# Patient Record
Sex: Female | Born: 1965 | Hispanic: Yes | Marital: Married | Smoking: Former smoker
Health system: Southern US, Community
[De-identification: ages and names within clinical notes are randomized; demographics above are authoritative.]

## PROBLEM LIST (undated history)

## (undated) DIAGNOSIS — F32A Depression, unspecified: Secondary | ICD-10-CM

## (undated) DIAGNOSIS — N921 Excessive and frequent menstruation with irregular cycle: Secondary | ICD-10-CM

## (undated) DIAGNOSIS — D509 Iron deficiency anemia, unspecified: Secondary | ICD-10-CM

## (undated) DIAGNOSIS — F329 Major depressive disorder, single episode, unspecified: Secondary | ICD-10-CM

## (undated) HISTORY — DX: Iron deficiency anemia, unspecified: D50.9

## (undated) HISTORY — DX: Depression, unspecified: F32.A

## (undated) HISTORY — DX: Major depressive disorder, single episode, unspecified: F32.9

## (undated) HISTORY — DX: Excessive and frequent menstruation with irregular cycle: N92.1

## (undated) HISTORY — PX: CHOLECYSTECTOMY: SHX55

---

## 2001-06-21 ENCOUNTER — Emergency Department (HOSPITAL_COMMUNITY): Admission: EM | Admit: 2001-06-21 | Discharge: 2001-06-22 | Payer: Self-pay | Admitting: Emergency Medicine

## 2001-06-22 ENCOUNTER — Encounter: Payer: Self-pay | Admitting: Emergency Medicine

## 2001-10-11 ENCOUNTER — Encounter: Payer: Self-pay | Admitting: Chiropractic Medicine

## 2001-10-11 ENCOUNTER — Ambulatory Visit (HOSPITAL_COMMUNITY): Admission: RE | Admit: 2001-10-11 | Discharge: 2001-10-11 | Payer: Self-pay | Admitting: Chiropractic Medicine

## 2001-11-05 ENCOUNTER — Emergency Department (HOSPITAL_COMMUNITY): Admission: EM | Admit: 2001-11-05 | Discharge: 2001-11-05 | Payer: Self-pay

## 2001-11-27 ENCOUNTER — Encounter: Payer: Self-pay | Admitting: Orthopedic Surgery

## 2001-11-27 ENCOUNTER — Encounter: Admission: RE | Admit: 2001-11-27 | Discharge: 2001-11-27 | Payer: Self-pay | Admitting: Orthopedic Surgery

## 2001-12-11 ENCOUNTER — Encounter: Admission: RE | Admit: 2001-12-11 | Discharge: 2001-12-11 | Payer: Self-pay | Admitting: Orthopedic Surgery

## 2001-12-11 ENCOUNTER — Encounter: Payer: Self-pay | Admitting: Orthopedic Surgery

## 2001-12-22 ENCOUNTER — Encounter: Payer: Self-pay | Admitting: Orthopedic Surgery

## 2001-12-22 ENCOUNTER — Encounter: Admission: RE | Admit: 2001-12-22 | Discharge: 2001-12-22 | Payer: Self-pay | Admitting: Orthopedic Surgery

## 2007-04-27 ENCOUNTER — Emergency Department (HOSPITAL_COMMUNITY): Admission: EM | Admit: 2007-04-27 | Discharge: 2007-04-27 | Payer: Self-pay | Admitting: Emergency Medicine

## 2007-05-01 ENCOUNTER — Ambulatory Visit (HOSPITAL_COMMUNITY): Admission: RE | Admit: 2007-05-01 | Discharge: 2007-05-01 | Payer: Self-pay | Admitting: Surgery

## 2007-05-01 ENCOUNTER — Inpatient Hospital Stay (HOSPITAL_COMMUNITY): Admission: EM | Admit: 2007-05-01 | Discharge: 2007-05-04 | Payer: Self-pay | Admitting: Emergency Medicine

## 2007-05-02 ENCOUNTER — Encounter (INDEPENDENT_AMBULATORY_CARE_PROVIDER_SITE_OTHER): Payer: Self-pay | Admitting: *Deleted

## 2007-05-08 ENCOUNTER — Inpatient Hospital Stay (HOSPITAL_COMMUNITY): Admission: EM | Admit: 2007-05-08 | Discharge: 2007-05-10 | Payer: Self-pay | Admitting: Emergency Medicine

## 2007-05-13 ENCOUNTER — Ambulatory Visit: Payer: Self-pay | Admitting: Gastroenterology

## 2007-05-16 ENCOUNTER — Ambulatory Visit: Payer: Self-pay | Admitting: Surgery

## 2007-05-16 ENCOUNTER — Encounter (INDEPENDENT_AMBULATORY_CARE_PROVIDER_SITE_OTHER): Payer: Self-pay | Admitting: Surgery

## 2007-05-16 ENCOUNTER — Ambulatory Visit (HOSPITAL_COMMUNITY): Admission: RE | Admit: 2007-05-16 | Discharge: 2007-05-16 | Payer: Self-pay | Admitting: Surgery

## 2007-06-10 ENCOUNTER — Encounter: Admission: RE | Admit: 2007-06-10 | Discharge: 2007-06-10 | Payer: Self-pay | Admitting: *Deleted

## 2007-07-03 ENCOUNTER — Ambulatory Visit (HOSPITAL_COMMUNITY): Admission: RE | Admit: 2007-07-03 | Discharge: 2007-07-03 | Payer: Self-pay | Admitting: Gastroenterology

## 2007-07-15 ENCOUNTER — Ambulatory Visit: Payer: Self-pay | Admitting: Gastroenterology

## 2010-04-25 ENCOUNTER — Encounter: Payer: Self-pay | Admitting: Internal Medicine

## 2010-04-25 ENCOUNTER — Ambulatory Visit: Payer: Self-pay | Admitting: Internal Medicine

## 2010-04-25 DIAGNOSIS — F329 Major depressive disorder, single episode, unspecified: Secondary | ICD-10-CM

## 2010-04-25 DIAGNOSIS — G43909 Migraine, unspecified, not intractable, without status migrainosus: Secondary | ICD-10-CM | POA: Insufficient documentation

## 2010-04-26 ENCOUNTER — Telehealth: Payer: Self-pay | Admitting: Licensed Clinical Social Worker

## 2010-04-26 ENCOUNTER — Telehealth: Payer: Self-pay | Admitting: Internal Medicine

## 2010-05-01 ENCOUNTER — Ambulatory Visit: Payer: Self-pay | Admitting: Internal Medicine

## 2010-05-01 ENCOUNTER — Encounter: Payer: Self-pay | Admitting: Internal Medicine

## 2010-05-01 LAB — CONVERTED CEMR LAB
ALT: 12 units/L (ref 0–35)
Albumin: 4.1 g/dL (ref 3.5–5.2)
Alkaline Phosphatase: 58 units/L (ref 39–117)
CO2: 25 meq/L (ref 19–32)
Cholesterol: 194 mg/dL (ref 0–200)
Glucose, Bld: 98 mg/dL (ref 70–99)
MCHC: 32.8 g/dL (ref 30.0–36.0)
MCV: 86.7 fL (ref >=78.0)
Platelets: 270 10*3/uL (ref 150–400)
Potassium: 3.8 meq/L (ref 3.5–5.3)
Sodium: 139 meq/L (ref 135–145)
Total Bilirubin: 0.8 mg/dL (ref 0.3–1.2)
Total Protein: 7.1 g/dL (ref 6.0–8.3)
Triglycerides: 127 mg/dL (ref <150)

## 2010-05-05 ENCOUNTER — Ambulatory Visit: Payer: Self-pay | Admitting: Family Medicine

## 2010-05-23 ENCOUNTER — Ambulatory Visit: Payer: Self-pay | Admitting: Internal Medicine

## 2010-05-23 ENCOUNTER — Encounter: Payer: Self-pay | Admitting: Internal Medicine

## 2010-05-23 DIAGNOSIS — N920 Excessive and frequent menstruation with regular cycle: Secondary | ICD-10-CM

## 2010-05-23 DIAGNOSIS — Z862 Personal history of diseases of the blood and blood-forming organs and certain disorders involving the immune mechanism: Secondary | ICD-10-CM

## 2010-05-23 LAB — CONVERTED CEMR LAB
Ferritin: 6 ng/mL — ABNORMAL LOW (ref 10–291)
Vitamin B-12: 363 pg/mL (ref 211–911)

## 2010-05-31 ENCOUNTER — Ambulatory Visit (HOSPITAL_COMMUNITY): Admission: RE | Admit: 2010-05-31 | Discharge: 2010-05-31 | Payer: Self-pay | Admitting: Internal Medicine

## 2010-06-05 ENCOUNTER — Encounter: Payer: Self-pay | Admitting: Internal Medicine

## 2010-06-27 ENCOUNTER — Ambulatory Visit: Payer: Self-pay | Admitting: Internal Medicine

## 2010-06-27 ENCOUNTER — Encounter: Payer: Self-pay | Admitting: Licensed Clinical Social Worker

## 2010-07-05 ENCOUNTER — Other Ambulatory Visit
Admission: RE | Admit: 2010-07-05 | Discharge: 2010-07-05 | Payer: Self-pay | Source: Home / Self Care | Admitting: Obstetrics and Gynecology

## 2010-07-05 ENCOUNTER — Ambulatory Visit: Payer: Self-pay | Admitting: Obstetrics and Gynecology

## 2010-07-05 ENCOUNTER — Ambulatory Visit (HOSPITAL_COMMUNITY)
Admission: RE | Admit: 2010-07-05 | Discharge: 2010-07-05 | Payer: Self-pay | Source: Home / Self Care | Attending: Internal Medicine | Admitting: Internal Medicine

## 2010-07-05 LAB — HM MAMMOGRAPHY: HM Mammogram: NEGATIVE

## 2010-07-20 ENCOUNTER — Ambulatory Visit: Payer: Self-pay | Admitting: Obstetrics and Gynecology

## 2010-07-23 HISTORY — PX: DILATION AND CURETTAGE OF UTERUS: SHX78

## 2010-07-24 ENCOUNTER — Ambulatory Visit: Admission: RE | Admit: 2010-07-24 | Discharge: 2010-07-24 | Payer: Self-pay | Source: Home / Self Care

## 2010-08-02 ENCOUNTER — Ambulatory Visit (HOSPITAL_COMMUNITY)
Admission: RE | Admit: 2010-08-02 | Discharge: 2010-08-02 | Payer: Self-pay | Source: Home / Self Care | Attending: Family Medicine | Admitting: Family Medicine

## 2010-08-02 ENCOUNTER — Ambulatory Visit (HOSPITAL_COMMUNITY): Admission: RE | Admit: 2010-08-02 | Payer: Self-pay | Source: Home / Self Care | Admitting: Family Medicine

## 2010-08-08 ENCOUNTER — Ambulatory Visit: Admission: RE | Admit: 2010-08-08 | Discharge: 2010-08-08 | Payer: Self-pay | Source: Home / Self Care

## 2010-08-08 ENCOUNTER — Encounter: Payer: Self-pay | Admitting: Licensed Clinical Social Worker

## 2010-08-08 LAB — CONVERTED CEMR LAB
Blood, UA: NEGATIVE
Leukocytes, UA: NEGATIVE
Nitrite: NEGATIVE
Protein, ur: NEGATIVE mg/dL
Urine Glucose: NEGATIVE mg/dL
Urobilinogen, UA: 0.2 (ref 0.0–1.0)

## 2010-08-10 ENCOUNTER — Ambulatory Visit
Admission: RE | Admit: 2010-08-10 | Discharge: 2010-08-10 | Payer: Self-pay | Source: Home / Self Care | Attending: Obstetrics and Gynecology | Admitting: Obstetrics and Gynecology

## 2010-08-11 NOTE — Progress Notes (Unsigned)
NAME:  Heidi Galvan, Heidi Galvan           ACCOUNT NO.:  [id number]  MEDICAL RECORD NO.:  [id number]          PATIENT TYPE:  WOC  LOCATION:  [facility]                   FACILITY:  [facility]  PHYSICIAN:  Argentina Donovan, MD        DATE OF BIRTH:  [birth year]  DATE OF SERVICE:  08/10/2010                                 CLINIC NOTE  HISTORY:  The patient is a 45 year old English-speaking Hispanic female who had an endometrial biopsy because she was having very heavy periods twice a month, lasting 5-7 days for which she used 7 pads a day.  She did endometrial biopsy, turned out to be fine.  She had a previous ultrasound that showed diffuse thickening in the endometrium, but we sent her for a hysterosalpingogram that showed same without polypoid filling defect to suggest polyp or fibroid.  So for the present time we will do nothing, I think that if this continues, she is a nonsmoker, we will place her on oral contraceptives.  I have told her this and have her come back in a year unless her periods persist and being heavy.  IMPRESSION:  Dysfunctional uterine bleeding.          ______________________________ Argentina Donovan, MD    PR/MEDQ  D:  08/10/2010  T:  08/11/2010  Job:  [id number]

## 2010-08-23 ENCOUNTER — Encounter: Payer: Self-pay | Admitting: *Deleted

## 2010-08-23 ENCOUNTER — Telehealth: Payer: Self-pay | Admitting: *Deleted

## 2010-08-23 NOTE — Telephone Encounter (Signed)
Pt called with c/o not being able to walk normal.  Also leg pain.  This is not new but worse. Will see tomorrow at 1:15 Pt informed

## 2010-08-24 ENCOUNTER — Other Ambulatory Visit: Payer: Self-pay | Admitting: Internal Medicine

## 2010-08-24 ENCOUNTER — Ambulatory Visit (HOSPITAL_COMMUNITY)
Admission: RE | Admit: 2010-08-24 | Discharge: 2010-08-24 | Disposition: A | Discharge status: Home / Self Care | Payer: Self-pay | Source: Ambulatory Visit | Attending: Internal Medicine | Admitting: Internal Medicine

## 2010-08-24 ENCOUNTER — Ambulatory Visit (INDEPENDENT_AMBULATORY_CARE_PROVIDER_SITE_OTHER): Payer: Self-pay | Admitting: Internal Medicine

## 2010-08-24 ENCOUNTER — Telehealth: Payer: Self-pay

## 2010-08-24 ENCOUNTER — Encounter: Payer: Self-pay | Admitting: Internal Medicine

## 2010-08-24 ENCOUNTER — Ambulatory Visit: Payer: Self-pay | Admitting: Internal Medicine

## 2010-08-24 VITALS — BP 124/78 | HR 79 | Temp 98.1°F | Ht 58.5 in | Wt 168.4 lb

## 2010-08-24 DIAGNOSIS — R300 Dysuria: Secondary | ICD-10-CM

## 2010-08-24 DIAGNOSIS — R1031 Right lower quadrant pain: Secondary | ICD-10-CM

## 2010-08-24 DIAGNOSIS — D259 Leiomyoma of uterus, unspecified: Secondary | ICD-10-CM | POA: Insufficient documentation

## 2010-08-24 DIAGNOSIS — R509 Fever, unspecified: Secondary | ICD-10-CM | POA: Insufficient documentation

## 2010-08-24 DIAGNOSIS — K449 Diaphragmatic hernia without obstruction or gangrene: Secondary | ICD-10-CM | POA: Insufficient documentation

## 2010-08-24 DIAGNOSIS — N7013 Chronic salpingitis and oophoritis: Secondary | ICD-10-CM | POA: Insufficient documentation

## 2010-08-24 LAB — CBC WITH DIFFERENTIAL/PLATELET
Basophils Absolute: 0.1 10*3/uL (ref 0.0–0.1)
Basophils Relative: 1 % (ref 0–1)
HCT: 36 % (ref 36.0–46.0)
Lymphocytes Relative: 27 % (ref 12–46)
MCHC: 34.2 g/dL (ref 30.0–36.0)
Monocytes Absolute: 0.4 10*3/uL (ref 0.1–1.0)
Monocytes Relative: 6 % (ref 3–12)
Neutro Abs: 4.2 10*3/uL (ref 1.7–7.7)
Neutrophils Relative %: 65 % (ref 43–77)
Platelets: 275 10*3/uL (ref 150–400)
RDW: 14.6 % (ref 11.5–15.5)
WBC: 6.4 10*3/uL (ref 4.0–10.5)

## 2010-08-24 LAB — POCT URINALYSIS DIPSTICK
Glucose, UA: NEGATIVE
Ketones, UA: NEGATIVE
Leukocytes, UA: NEGATIVE
Nitrite, UA: NEGATIVE
Protein, UA: NEGATIVE
Urobilinogen, UA: 0.2

## 2010-08-24 LAB — BASIC METABOLIC PANEL
BUN: 11 mg/dL (ref 6–23)
Calcium: 8.1 mg/dL — ABNORMAL LOW (ref 8.4–10.5)
Glucose, Bld: 89 mg/dL (ref 70–99)
Potassium: 3.6 mEq/L (ref 3.5–5.3)

## 2010-08-24 MED ORDER — IOHEXOL 300 MG/ML  SOLN
100.0000 mL | Freq: Once | INTRAMUSCULAR | Status: AC | PRN
  Administered 2010-08-24: 100 mL via INTRAVENOUS

## 2010-08-24 NOTE — Assessment & Plan Note (Signed)
Summary: est-ck/fu/meds/cfb   Vital Signs:  Patient profile:   45 year old female Height:      59.5 inches (151.13 cm) Weight:      160.3 pounds (72.86 kg) BMI:     31.95 Temp:     97.7 degrees F oral Pulse rate:   74 / minute BP sitting:   121 / 80  (right arm) Cuff size:   regular  Vitals Entered By: Chinita Pester RN (June 27, 2010 9:18 AM) CC: F/U.  Med refills. Continues to c/o right lower back pain.  ?sinus pain w/right ear pain. Is Patient Diabetic? No Pain Assessment Patient in pain? yes     Location: right lower back Intensity: 7 Type: stabbing Onset of pain  Constant; worse in the morning Nutritional Status BMI of > 30 = obese  Have you ever been in a relationship where you felt threatened, hurt or afraid?No   Does patient need assistance? Functional Status Self care Ambulation Normal   CC:  F/U.  Med refills. Continues to c/o right lower back pain.  ?sinus pain w/right ear pain.Marland Kitchen  History of Present Illness: 1. C/o facial TTP and front bilateral HA. described as "congestion," with chills, and green,m thich nasal discharge for 5 days. Denies any visual or speech deficits or weakness. 2.Iron-deff-cy anemia 2/2 DUB ->patient missed her OB/GYN appointment on June 25, 2010. Takes Feosol. Reports felling much more energetic. 3. Depression. Reports felling much better with Fluoxetine. No SI/HI or mania; no side-effects with med.  Depression History:      The patient denies a depressed mood most of the day and a diminished interest in her usual daily activities.         Preventive Screening-Counseling & Management  Alcohol-Tobacco     Alcohol drinks/day: 0     Smoking Status: never  Caffeine-Diet-Exercise     Does Patient Exercise: yes     Type of exercise: DVD's/weights  Medications Prior to Update: 1)  Fluoxetine Hcl 40 Mg Caps (Fluoxetine Hcl) .... Take One Tablet By Mouth Daily 2)  Naproxen 500 Mg Tabs (Naproxen) .... Take One Tablet By Mouth Two  Times A Day As Needed 3)  Ferrous Fumarate 325 (106 Fe) Mg Tabs (Ferrous Fumarate) .... Take One Tablet Three Times A Day With Orange Juice For Anemia  Allergies (verified): 1)  ! Penicillin  Past History:  Past medical, surgical, family and social histories (including risk factors) reviewed for relevance to current acute and chronic problems.  Past Medical History: Reviewed history from 04/25/2010 and no changes required. Depression  Family History: Reviewed history from 04/25/2010 and no changes required. Sister->depression  Social History: Reviewed history from 04/25/2010 and no changes required. unemployed; uninsured no nicotine, ETOH, or illicit drug use. high school level education separated from her husband.  Physical Exam  General:  Well-developed,well-nourished,in no acute distress; alert,appropriate and cooperative throughout examination Head:  Normocephalic and atraumatic without obvious abnormalities. No apparent alopecia or balding. Eyes:  vision grossly intact.   Ears:  External ear exam shows no significant lesions or deformities.  Otoscopic examination reveals clear canals, tympanic membranes are intact bilaterally without bulging, retraction, inflammation or discharge. Hearing is grossly normal bilaterally. Nose:  Bilateral frontal simus TTP noted; erythmatous turbinates with thick, green nasal discharge present. Mouth:  Oral mucosa and oropharynx without lesions or exudates.  Teeth in good repair. Neck:  No deformities, masses, or tenderness noted. Lungs:  Normal respiratory effort, chest expands symmetrically. Lungs are clear to auscultation,  no crackles or wheezes. Heart:  Normal rate and regular rhythm. S1 and S2 normal without gallop, murmur, click, rub or other extra sounds. Abdomen:  Bowel sounds positive,abdomen soft and non-tender without masses, organomegaly or hernias noted. Msk:  no joint tenderness, no joint swelling, and no redness over joints.     Extremities:  No clubbing, cyanosis, edema, or deformity noted with normal full range of motion of all joints.   Neurologic:  alert & oriented X3, cranial nerves II-XII intact, and gait normal.   Skin:  Intact without suspicious lesions or rashes Cervical Nodes:  No lymphadenopathy noted Psych:  Cognition and judgment appear intact. Alert and cooperative with normal attention span and concentration. 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 Scheduled a follow up appointment with OB/GYN on July 05, 2010. Continue with by mouth Iron therapy.  Problem # 2:  EXCESSIVE MENSTRUAL BLEEDING (ICD-626.2) Assessment: Unchanged  Per above, pelvic US revealed endometrial thickening vs leiomyoma ->will f/u with OB'GYN on 07/05/2010. Importance of a follow up wawss emphasized.  Discussed medication use.   Problem # 3:  DEPRESSION (ICD-311) Assessment: Improved  Improved.  Her updated medication list for this problem includes:    Fluoxetine Hcl 40 Mg Caps (Fluoxetine hcl) .Marland Kitchen... Take one tablet by mouth daily  Discussed treatment options, including trial of antidpressant medication. Will refer to behavioral health. Follow-up call in in 24-48 hours and recheck in 2 weeks, sooner as needed. Patient agrees to call if any worsening of symptoms or thoughts of doing harm arise. Verified that the patient has no suicidal ideation at this time.   Problem # 4:  ACUTE FRONTAL SINUSITIS (ICD-461.1)  Saline nasal irriations three times a day as needed. F/U in 10 days if no improvement. Her updated medication list for this problem includes:    Ery-tab 333 Mg Tbec (Erythromycin base) .Marland Kitchen... Take one tablet by mouth q 8 hours with meals for 10 days    Flonase 50 Mcg/act Susp (Fluticasone propionate) ..... One spray q nare daily  Instructed on treatment. Call if symptoms persist or  worsen.   Complete Medication List: 1)  Fluoxetine Hcl 40 Mg Caps (Fluoxetine hcl) .... Take one tablet by mouth daily 2)  Naproxen 500 Mg Tabs (Naproxen) .... Take one tablet by mouth two times a day as needed 3)  Ferrous Fumarate 325 (106 Fe) Mg Tabs (Ferrous fumarate) .... Take one tablet three times a day with orange juice for anemia 4)  Ery-tab 333 Mg Tbec (Erythromycin base) .... Take one tablet by mouth q 8 hours with meals for 10 days 5)  Flonase 50 Mcg/act Susp (Fluticasone propionate) .... One spray q nare daily  Other Orders: Ophthalmology Referral (Ophthalmology) Mammogram (Screening) (Mammo)  Patient Instructions: 1)  Please, pick up yr prescriptions at the pharmacy. 2)  Please, follow up with your gynecologist referral on December 14th, 2011. 3)  Please, drink plenty of fluids and call with any questions. Prescriptions: ERY-TAB 333 MG TBEC (ERYTHROMYCIN BASE) Take one tablet by mouth q 8 hours with meals for 10 days  #30 x 0   Entered and Authorized by:   Deatra Robinson MD   Signed by:   Deatra Robinson MD on 06/27/2010   Method used:   Print then Give to Patient   RxID:   [id number] ERY-TAB 333 MG TBEC (ERYTHROMYCIN BASE) Take one tablet by mouth q 8  hours with meals for 10 days  #30 x 0   Entered and Authorized by:   Deatra Robinson MD   Signed by:   Deatra Robinson MD on 06/27/2010   Method used:   Electronically to        [facility]. [phone]* (retail)       [address]       [place], [place]  [address]       Ph: [phone]       Fax: [phone]   RxID:   [id number] FLONASE 50 MCG/ACT SUSP (FLUTICASONE PROPIONATE) One spray q nare daily  #1 x 0   Entered and Authorized by:   Deatra Robinson MD   Signed by:   Deatra Robinson MD on 06/27/2010   Method used:   Electronically to        [facility]. [phone]* (retail)       [address]       [place], [place]  [address]       Ph:  [phone]       Fax: [phone]   RxID:   [id number]    Orders Added: 1)  Est. Patient Level IV [[address]] 2)  Ophthalmology Referral [Ophthalmology] 3)  Mammogram (Screening) [Mammo]     Prevention & Chronic Care Immunizations   Influenza vaccine: Fluvax Non-MCR  (04/25/2010)    Tetanus booster: 04/25/2010: Tdap    Pneumococcal vaccine: Not documented  Other Screening   Pap smear: Not documented    Mammogram: Not documented   Smoking status: never  (06/27/2010)  Lipids   Total Cholesterol: 194  (05/01/2010)   LDL: 113  (05/01/2010)   LDL Direct: Not documented   HDL: 56  (05/01/2010)   Triglycerides: 127  (05/01/2010)

## 2010-08-24 NOTE — Miscellaneous (Signed)
Summary: Soc. Work  Consult with Dr. Denton Meek who informed me that patient was doing much better on antidepressant prescribed but likely could use resources relating to the divorce and children.   Gave information concerning [facility] counseling who will serve uninsureds and the entire family for counseling.

## 2010-08-24 NOTE — Assessment & Plan Note (Signed)
Summary: EST-4 WEEK RECHECK/CH   Vital Signs:  Patient profile:   45 year old female Height:      59.5 inches (151.13 cm) Weight:      161.4 pounds (73.36 kg) BMI:     32.17 Temp:     97.7 degrees F (36.50 degrees C) oral Pulse rate:   91 / minute BP sitting:   138 / 86  (left arm) Cuff size:   regular  Vitals Entered By: Cynda Familia Duncan Dull) (May 23, 2010 4:22 PM) CC: f/u, lab results Is Patient Diabetic? No Pain Assessment Patient in pain? no      Nutritional Status BMI of > 30 = obese  Have you ever been in a relationship where you felt threatened, hurt or afraid?No   Does patient need assistance? Functional Status Self care Ambulation Normal   CC:  f/u and lab results.  History of Present Illness: Follow up appointment. 1. reports fatigue, feeling sad, weight gain with an increased appetite (gained  6 lbs over 3 months). patient states that she is under a lot of stress at home --has marital issues and difficult relationship with her children. Denis SI/HI or mania.    Depression History:      The patient denies a depressed mood most of the day and a diminished interest in her usual daily activities.  Positive alarm features for depression include significant weight gain, insomnia, fatigue (loss of energy), feelings of worthlessness (guilt), and impaired concentration (indecisiveness).  However, she denies significant weight loss, psychomotor agitation, psychomotor retardation, and recurrent thoughts of death or suicide.  The patient denies symptoms of a manic disorder including persistently & abnormally elevated mood, abnormally & persistently irritable mood, less need for sleep, talkative or feels need to keep talking, distractibility, flight of ideas, increase in goal-directed activity, psychomotor agitation, inflated self-esteem or grandiosity, excessive buying sprees, excessive sexual indiscretions, and excessive foolish business investments.    Psychosocial stress factors include a recent marital separation and major life changes.  The patient denies that she feels like life is not worth living, denies that she wishes that she were dead, and denies that she has thought about ending her life.        Comments:  "feel better".   Preventive Screening-Counseling & Management  Alcohol-Tobacco     Alcohol drinks/day: 0     Smoking Status: never  Allergies: 1)  ! Penicillin  Past History:  Past Medical History: Last updated: 04/25/2010 Depression  Family History: Last updated: 04/25/2010 Sister->depression  Social History: Last updated: 04/25/2010 unemployed; uninsured no nicotine, ETOH, or illicit drug use. high school level education separated from her husband.  Risk Factors: Alcohol Use: 0 (05/23/2010) Exercise: yes (04/25/2010)  Risk Factors: Smoking Status: never (05/23/2010)  Review of Systems       per HPI  Physical Exam  General:  Well-developed,well-nourished,in no acute distress; alert,appropriate and cooperative throughout examination Head:  Normocephalic and atraumatic without obvious abnormalities. No apparent alopecia or balding. Nose:  External nasal examination shows no deformity or inflammation. Nasal mucosa are pink and moist without lesions or exudates. Mouth:  pharynx pink and moist, no erythema, no exudates, no posterior lymphoid hypertrophy, and fair dentition.   Neck:  No deformities, masses, or tenderness noted. Lungs:  Normal respiratory effort, chest expands symmetrically. Lungs are clear to auscultation, no crackles or wheezes. Heart:  Normal rate and regular rhythm. S1 and S2 normal without gallop, murmur, click, rub or other extra sounds. Abdomen:  Bowel sounds positive,abdomen soft and non-tender without masses, organomegaly or hernias noted. Msk:  No deformity or scoliosis noted of thoracic or lumbar spine.   Extremities:  No clubbing, cyanosis, edema, or deformity noted with  normal full range of motion of all joints.   Neurologic:  alert & oriented X3.   Skin:  turgor normal, color normal, no rashes, no suspicious lesions, and no petechiae.   Cervical Nodes:  No lymphadenopathy noted Psych:  Oriented X3, memory intact for recent and remote, normally interactive, good eye contact, not anxious appearing, and tearful.     Impression & Recommendations:  Problem # 1:  IRON DEFICIENCY ANEMIA, HX OF (ICD-V12.3) Assessment New Ferritin returned at 6. Will need Iron supplementation. Likely 2/2 to DUB. Will follow up on transvaginal US and discuss both results in 1-2 weeks. Orders: T-Ferritin [id number]) T-Vitamin B12 ([id number])  Problem # 2:  EXCESSIVE MENSTRUAL BLEEDING (ICD-626.2) Assessment: New Per above, will order tansvag [place] to evaluate for leiomyomas and endometrial strip; and follow up in 1 week. Orders: Ultrasound (Ultrasound)  Discussed medication use.   Problem # 3:  DEPRESSION (ICD-311) Assessment: Improved  Her updated medication list for this problem includes:    Fluoxetine Hcl 40 Mg Caps (Fluoxetine hcl) .Marland Kitchen... Take one tablet by mouth daily  Discussed treatment options, including trial of antidpressant medication. Will refer to behavioral health. Follow-up call in in 24-48 hours and recheck in 2 weeks, sooner as needed. Patient agrees to call if any worsening of symptoms or thoughts of doing harm arise. Verified that the patient has no suicidal ideation at this time.   Problem # 4:  MIGRAINE UNSP W/O INTRACT W/O STATUS MIGRAINOSUS (ICD-346.90) Assessment: Improved instructed to call 911 or go to ED if signs get progressively worse: in severity, frequency; dizziness, speech or vision deficits; weakness. patient verbalised understanding. Her updated medication list for this problem includes:    Naproxen 500 Mg Tabs (Naproxen) .Marland Kitchen... Take one tablet by mouth two times a day as needed  Headache diary discussed.  Complete Medication List: 1)   Fluoxetine Hcl 40 Mg Caps (Fluoxetine hcl) .... Take one tablet by mouth daily 2)  Naproxen 500 Mg Tabs (Naproxen) .... Take one tablet by mouth two times a day as needed 3)  Ferrous Fumarate 325 (106 Fe) Mg Tabs (Ferrous fumarate) .... Take one tablet three times a day with orange juice for anemia  Patient Instructions: 1)  Please, follow up with your appointment for an [place]. 2)  Please, make an appointment with Dr. Denton Meek for a Well woman exam. 3)  Please, call with any questions. 4)  Follow up after ultrasound. Prescriptions: FERROUS FUMARATE 325 (106 FE) MG TABS (FERROUS FUMARATE) Take one tablet three times a day with orange juice for anemia  #90 x 3   Entered and Authorized by:   Deatra Robinson MD   Signed by:   Deatra Robinson MD on 05/26/2010   Method used:   Faxed to ...       [facility] (retail)       [address] [place], [place]  [address]       Ph: [phone]       Fax: [phone]   RxID:   [id number] FLUOXETINE HCL 40 MG CAPS (FLUOXETINE HCL) Take one tablet by mouth daily  #30 x 11   Entered and Authorized by:   Deatra Robinson MD   Signed by:   Deatra Robinson MD  on 05/23/2010   Method used:   Reprint   RxID:   [id number] NAPROXEN 500 MG TABS (NAPROXEN) Take one tablet by mouth two times a day as needed  #60 x 3   Entered and Authorized by:   Deatra Robinson MD   Signed by:   Deatra Robinson MD on 05/23/2010   Method used:   Print then Give to Patient   RxID:   [id number] FLUOXETINE HCL 40 MG CAPS (FLUOXETINE HCL) Take one tablet by mouth daily  #30 x 11   Entered and Authorized by:   Deatra Robinson MD   Signed by:   Deatra Robinson MD on 05/23/2010   Method used:   Electronically to        [facility]. [phone]* (retail)       [address]       [place], [place]  [address]       Ph: [phone]       Fax: [phone]   RxID:   [id number]    Orders Added: 1)  Est.  Patient Level IV [41660] 2)  T-Ferritin [[id number]] 3)  T-Vitamin B12 [[id number]] 4)  Est. Patient Level IV [23557] 5)  Ultrasound [Ultrasound]   Process Orders Check Orders Results:     [facility]: ABN not required for this insurance Tests Sent for requisitioning (May 29, 2010 3:15 PM):     05/23/2010: [facility] -- T-Ferritin [phone] (signed)     05/23/2010: [facility] -- T-Vitamin B12 [[id number]] (signed)     Prevention & Chronic Care Immunizations   Influenza vaccine: Fluvax Non-MCR  (04/25/2010)    Tetanus booster: 04/25/2010: Tdap    Pneumococcal vaccine: Not documented  Other Screening   Pap smear: Not documented    Mammogram: Not documented   Smoking status: never  (05/23/2010)  Lipids   Total Cholesterol: 194  (05/01/2010)   LDL: 113  (05/01/2010)   LDL Direct: Not documented   HDL: 56  (05/01/2010)   Triglycerides: 127  (05/01/2010)

## 2010-08-24 NOTE — Assessment & Plan Note (Signed)
Summary: HEADACHES/PAIN IN KNEE/SB.   Vital Signs:  Patient profile:   45 year old female Height:      58.5 inches (148.59 cm) Weight:      164 pounds (74.55 kg) BMI:     33.81 Temp:     97.2 degrees F Pulse rate:   82 / minute BP sitting:   123 / 82  (left arm) Cuff size:   regular  Vitals Entered By: Dorie Rank RN (July 24, 2010 3:58 PM) CC: c/o headaches starting about 2 weeks ago, chilly, pain in body, dry nose, sometimes severe, frontal location, "feel tired" different than usual - also c/o pain (pointing to lower right hip area) for 3 months states "I think it is my kidney, feels like something tearing inside sometimes", Depression Is Patient Diabetic? No Pain Assessment Patient in pain? yes     Location: h/a and right hip Intensity: 8 -h/a, right hip - 8 Onset of pain  sometimes pain meds help - also states right shoulder blade hurts at times with right hip area Nutritional Status BMI of > 30 = obese  Have you ever been in a relationship where you felt threatened, hurt or afraid?No   Does patient need assistance? Functional Status Self care Ambulation Normal Comments states last dr checked her kidneys and they were fine - states her 40 yr old son very concerned about her and wants to help her feel better - dtr is 64 "is a rebel...thinks that she knows everything"   CC:  c/o headaches starting about 2 weeks ago, chilly, pain in body, dry nose, sometimes severe, frontal location, "feel tired" different than usual - also c/o pain (pointing to lower right hip area) for 3 months states "I think it is my kidney, feels like something tearing inside sometimes", and Depression.  History of Present Illness: 45 yr old woman with pmhx as described below comes to the clinic complaining of sinus drainage associated with headache, myalgias, and sinus pressure which have been going on for 2 weeks. Patient reports that 2 nights ago she had a fever of 103. Has some dry cough especially  at night.   Depression History:      The patient denies a depressed mood most of the day and a diminished interest in her usual daily activities.        Comments:  pain makes me "irritable" - sone toldher she seemed "sad" -" some days good, some bad".   Preventive Screening-Counseling & Management  Alcohol-Tobacco     Alcohol drinks/day: 0     Smoking Status: never  Caffeine-Diet-Exercise     Does Patient Exercise: yes     Type of exercise: DVD's/weights  Problems Prior to Update: 1)  Acute Frontal Sinusitis  (ICD-461.1) 2)  Iron Deficiency Anemia, Hx of  (ICD-V12.3) 3)  Excessive Menstrual Bleeding  (ICD-626.2) 4)  Preventive Health Care  (ICD-V70.0) 5)  Migraine Unsp w/o Intract w/o Status Migrainosus  (ICD-346.90) 6)  Depression  (ICD-311)  Medications Prior to Update: 1)  Fluoxetine Hcl 40 Mg Caps (Fluoxetine Hcl) .... Take One Tablet By Mouth Daily 2)  Naproxen 500 Mg Tabs (Naproxen) .... Take One Tablet By Mouth Two Times A Day As Needed 3)  Ferrous Fumarate 325 (106 Fe) Mg Tabs (Ferrous Fumarate) .... Take One Tablet Three Times A Day With Orange Juice For Anemia  Current Medications (verified): 1)  Fluoxetine Hcl 40 Mg Caps (Fluoxetine Hcl) .... Take One Tablet By Mouth Daily 2)  Naproxen  500 Mg Tabs (Naproxen) .... Take One Tablet By Mouth Two Times A Day As Needed 3)  Ferrous Fumarate 325 (106 Fe) Mg Tabs (Ferrous Fumarate) .... Take One Tablet Three Times A Day With Orange Juice For Anemia  Allergies: 1)  ! Penicillin  Directives: 1)  Full Code   Past History:  Past Medical History: Last updated: 04/25/2010 Depression  Family History: Last updated: 04/25/2010 Sister->depression  Social History: Last updated: 04/25/2010 unemployed; uninsured no nicotine, ETOH, or illicit drug use. high school level education separated from her husband.  Risk Factors: Alcohol Use: 0 (07/24/2010) Exercise: yes (07/24/2010)  Risk Factors: Smoking Status: never  (07/24/2010)  Family History: Reviewed history from 04/25/2010 and no changes required. Sister->depression  Social History: Reviewed history from 04/25/2010 and no changes required. unemployed; uninsured no nicotine, ETOH, or illicit drug use. high school level education separated from her husband.  Review of Systems       The patient complains of fever and headaches.  The patient denies chest pain, syncope, dyspnea on exertion, peripheral edema, hemoptysis, abdominal pain, melena, hematochezia, severe indigestion/heartburn, hematuria, muscle weakness, difficulty walking, unusual weight change, abnormal bleeding, and enlarged lymph nodes.    Physical Exam  General:  NAD, vitals reviewed Eyes:  vision grossly intact.   Ears:  no external deformities.   Nose:  L frontal sinus tenderness, L maxillary sinus tenderness, R frontal sinus tenderness, and R maxillary sinus tenderness.   Mouth:  pharyngeal erythema, no exudates Neck:  No deformities, masses, or tenderness noted. Lungs:  Normal respiratory effort, chest expands symmetrically. Lungs are clear to auscultation, no crackles or wheezes. Heart:  Normal rate and regular rhythm. S1 and S2 normal without gallop, murmur, click, rub or other extra sounds. Abdomen:  Bowel sounds positive,abdomen soft and non-tender without masses, organomegaly or hernias noted. Msk:  no joint tenderness, no joint swelling, and no redness over joints.   Extremities:  No clubbing, cyanosis, edema, or deformity noted with normal full range of motion of all joints.   Neurologic:  alert & oriented X3, cranial nerves II-XII intact, and gait normal.     Impression & Recommendations:  Problem # 1:  ACUTE FRONTAL SINUSITIS (ICD-461.1) Will treat for acute bacterial sinus infection. Start patient on doxy two times a day for 10 days, sudafed, and instructed to do nasal saline irrigation. Follow up in 2 weeks.  Her updated medication list for this problem  includes:    Doxycycline Hyclate 100 Mg Caps (Doxycycline hyclate) .Marland Kitchen... Take 1 tablet by mouth two times a day x 10 days    Flonase 50 Mcg/act Susp (Fluticasone propionate) .Marland Kitchen... 2 sprays/nostril per day    Sudafed Pe Maximum Strength 10 Mg Tabs (Phenylephrine hcl) .Marland Kitchen... Take by mouth 1-2 tablets every 4 hours as needed for nasal congestion  Orders: T-CBC w/Diff ([id number])  Complete Medication List: 1)  Fluoxetine Hcl 40 Mg Caps (Fluoxetine hcl) .... Take one tablet by mouth daily 2)  Naproxen 500 Mg Tabs (Naproxen) .... Take one tablet by mouth two times a day as needed 3)  Ferrous Fumarate 325 (106 Fe) Mg Tabs (Ferrous fumarate) .... Take one tablet three times a day with orange juice for anemia 4)  Doxycycline Hyclate 100 Mg Caps (Doxycycline hyclate) .... Take 1 tablet by mouth two times a day x 10 days 5)  Flonase 50 Mcg/act Susp (Fluticasone propionate) .... 2 sprays/nostril per day 6)  Sudafed Pe Maximum Strength 10 Mg Tabs (Phenylephrine hcl) .... Take by mouth 1-2  tablets every 4 hours as needed for nasal congestion  Patient Instructions: 1)  Please schedule a follow-up appointment in 2 weeks. 2)  Start taking Doxycycline 100mg  two times a day for 10 days. Also use flonase, Sudafed, and nasal saline irragation. 3)  Take all medication as directed. Prescriptions: SUDAFED PE MAXIMUM STRENGTH 10 MG TABS (PHENYLEPHRINE HCL) Take by mouth 1-2 tablets every 4 hours as needed for nasal congestion  #36 x 0   Entered and Authorized by:   Laren Everts MD   Signed by:   Laren Everts MD on 07/24/2010   Method used:   Faxed to ...       [facility] Department (retail)       [address] [place], [place]  [address]       Ph: [phone]       Fax: [phone]   RxID:   [id number] FLONASE 50 MCG/ACT SUSP (FLUTICASONE PROPIONATE) 2 sprays/nostril per day  #1 x 2   Entered and Authorized by:   Laren Everts MD   Signed by:   Laren Everts MD on 07/24/2010   Method used:   Faxed to ...       [facility] Department (retail)       [address] [place], [place]  [address]       Ph: [phone]       Fax: [phone]   RxID:   [id number] DOXYCYCLINE HYCLATE 100 MG CAPS (DOXYCYCLINE HYCLATE) Take 1 tablet by mouth two times a day X 10 days  #20 x 0   Entered and Authorized by:   Laren Everts MD   Signed by:   Laren Everts MD on 07/24/2010   Method used:   Faxed to ...       [facility] Department (retail)       [address] [place], [place]  [address]       Ph: [phone]       Fax: [phone]   RxID:   [id number] SUDAFED PE MAXIMUM STRENGTH 10 MG TABS (PHENYLEPHRINE HCL) Take by mouth 1-2 tablets every 4 hours as needed for nasal congestion  #36 x 0   Entered and Authorized by:   Laren Everts MD   Signed by:   Laren Everts MD on 07/24/2010   Method used:   Electronically to        [facility] [phone]* (retail)       [address]       [place], [place]  [address]       Ph: [phone]       Fax: [phone]   RxID:   [id number] FLONASE 50 MCG/ACT SUSP (FLUTICASONE PROPIONATE) 2 sprays/nostril per day  #1 x 2   Entered and Authorized by:   Laren Everts MD   Signed by:   Laren Everts MD on 07/24/2010   Method used:   Electronically to        [facility]. [phone]* (retail)       [address]       [place], [place]  [address]       Ph: [phone]       Fax: [phone]   RxID:   [id number] DOXYCYCLINE HYCLATE 100 MG CAPS (DOXYCYCLINE HYCLATE)  Take 1 tablet by mouth two times a day X 10 days  #20 x 0   Entered and Authorized by:   Laren Everts MD   Signed by:   Laren Everts MD on 07/24/2010   Method used:   Electronically to        [facility]. [phone]* (retail)       [address]       [place], [place]  [address]       Ph: [phone]       Fax: [phone]   RxID:   [id number]    Orders Added: 1)  T-CBC w/Diff [[id number]] 2)  Est. Patient Level III [[address]]   Process Orders Check Orders Results:     [facility]: ABN not required for this insurance Tests Sent for requisitioning (July 25, 2010 12:25 PM):     07/24/2010: [facility] -- [facility] w/Diff [[id number]] (signed)

## 2010-08-24 NOTE — Progress Notes (Signed)
Summary: phone note/gp  Phone Note Call from Patient   Summary of Call: Pt. called about fasting labs but her next appt. is at 4:00PM. Could you put in the labs and the pt. can have them drawn prior to the appt.?  Thanks Initial call taken by: Chinita Pester RN,  April 26, 2010 9:20 AM  Follow-up for Phone Call        Provider Notified Follow-up by: Deatra Robinson MD,  April 27, 2010 10:18 PM  New Problems: PREVENTIVE HEALTH CARE (ICD-V70.0)   New Problems: PREVENTIVE HEALTH CARE (ICD-V70.0)  Appended Document: phone note/gp Pt. was called; she will come Mon.10/10 for labs.

## 2010-08-24 NOTE — Progress Notes (Signed)
Summary: Soc. Work  Programme researcher, broadcasting/film/video of Call: Returned patient's phone call.  She is looking for job resources and so I told her I would send job resources in the mail to her including [employer], [employer] and Copywriter, advertising.   In the meantime I see there is SW referral for psyche as well as legal assistance.  She did not mention any of those items in her phone call to me.  I will be out of the office until 10/17 and informed her of that/ will follow-up with her to address multiple issues.

## 2010-08-24 NOTE — Assessment & Plan Note (Signed)
Summary: NEW TO MD AND CLINIC OK TO ADD PER [facility]   Vital Signs:  Patient profile:   45 year old female Height:      59.5 inches (151.13 cm) Weight:      160.2 pounds (72.82 kg) BMI:     31.93 Temp:     97.6 degrees F oral Pulse rate:   77 / minute BP sitting:   127 / 82  (right arm)  Vitals Entered By: Chinita Pester RN (April 25, 2010 3:26 PM) CC: New to clinic/MD.  Heavy menstrual. Right lower back pain which radiates to right groin. Is Patient Diabetic? No Pain Assessment Patient in pain? yes     Location: right lower back Intensity: 8 Type: aching Onset of pain  Intermittent Nutritional Status BMI of > 30 = obese  Have you ever been in a relationship where you felt threatened, hurt or afraid?No   Does patient need assistance? Functional Status Self care Ambulation Normal   CC:  New to clinic/MD.  Heavy menstrual. Right lower back pain which radiates to right groin.Marland Kitchen  History of Present Illness: New patient is here to establish her care; 1. c/o HA x 6 months. bifrontal; pressure like; feels nauseated 'occasionally,'no dizziness; photosensitivity noted; lasts for 2-3 hours at a time; induced with stress. No similar Sx in the past. Notes that HA started with the the onset of depression. 800 mg Ibuprofen alleviates the pain. Denies any nocturnal signs. Has a hx of head trauma (took a fall in 06/2008). Mri was done at that time; per patient's reprot, had a C-spine herniated disc. 2. Feels depressed. Under "lots of stress." Denies SI/HI or mania. Unemployed; separated from her spouse; one daughter is pregnant.  Depression History:      The patient denies a depressed mood most of the day and a diminished interest in her usual daily activities.         Preventive Screening-Counseling & Management  Alcohol-Tobacco     Alcohol drinks/day: 0     Smoking Status: never  Caffeine-Diet-Exercise     Does Patient Exercise: yes     Type of exercise:  DVD's/weights  Problems Prior to Update: 1)  Migraine Unsp w/o Intract w/o Status Migrainosus  (ICD-346.90) 2)  Depression  (ICD-311)  Current Problems (verified): 1)  Migraine Unsp w/o Intract w/o Status Migrainosus  (ICD-346.90) 2)  Depression  (ICD-311)  Medications Prior to Update: 1)  None  Allergies (verified): 1)  ! Penicillin  Directives (verified): 1)  Full Code   Past History:  Past Medical History: Depression  Family History: Reviewed history and no changes required. Sister->depression  Social History: Reviewed history and no changes required. unemployed; uninsured no nicotine, ETOH, or illicit drug use. high school level education separated from her husband.Smoking Status:  never Does Patient Exercise:  yes  Review of Systems General:  Denies chills, fatigue, fever, loss of appetite, malaise, sleep disorder, sweats, weakness, and weight loss. Eyes:  Denies blurring, discharge, double vision, eye irritation, eye pain, halos, itching, light sensitivity, red eye, vision loss-1 eye, and vision loss-both eyes. ENT:  Denies decreased hearing, difficulty swallowing, ear discharge, earache, hoarseness, nasal congestion, nosebleeds, postnasal drainage, ringing in ears, sinus pressure, and sore throat. CV:  Denies bluish discoloration of lips or nails, chest pain or discomfort, difficulty breathing at night, difficulty breathing while lying down, fainting, fatigue, leg cramps with exertion, lightheadness, near fainting, palpitations, shortness of breath with exertion, swelling of feet, swelling of hands, and weight gain.  Resp:  Denies chest discomfort, chest pain with inspiration, cough, coughing up blood, excessive snoring, hypersomnolence, morning headaches, pleuritic, shortness of breath, sputum productive, and wheezing. GI:  Denies abdominal pain, bloody stools, change in bowel habits, constipation, dark tarry stools, diarrhea, excessive appetite, gas, hemorrhoids,  indigestion, loss of appetite, nausea, vomiting, vomiting blood, and yellowish skin color. GU:  Denies abnormal vaginal bleeding, decreased libido, discharge, dysuria, genital sores, hematuria, incontinence, nocturia, urinary frequency, and urinary hesitancy. MS:  Denies joint pain, joint redness, joint swelling, loss of strength, low back pain, mid back pain, muscle aches, muscle , cramps, muscle weakness, stiffness, and thoracic pain.  Physical Exam  General:  Well-developed,well-nourished,in no acute distress; alert,appropriate and cooperative throughout examination Head:  Normocephalic and atraumatic without obvious abnormalities. No apparent alopecia or balding. Eyes:  No corneal or conjunctival inflammation noted. EOMI. Perrla. Funduscopic exam benign, without hemorrhages, exudates or papilledema. Vision grossly normal. Ears:  External ear exam shows no significant lesions or deformities.  Otoscopic examination reveals clear canals, tympanic membranes are intact bilaterally without bulging, retraction, inflammation or discharge. Hearing is grossly normal bilaterally. Nose:  External nasal examination shows no deformity or inflammation. Nasal mucosa are pink and moist without lesions or exudates. Mouth:  pharynx pink and moist, no erythema, no exudates, no posterior lymphoid hypertrophy, and fair dentition.   Neck:  No deformities, masses, or tenderness noted. Chest Wall:  No deformities, masses, or tenderness noted. Breasts:  deferred Lungs:  Normal respiratory effort, chest expands symmetrically. Lungs are clear to auscultation, no crackles or wheezes. Heart:  Normal rate and regular rhythm. S1 and S2 normal without gallop, murmur, click, rub or other extra sounds. Abdomen:  Bowel sounds positive,abdomen soft and non-tender without masses, organomegaly or hernias noted. Rectal:  refused Genitalia:  refused Msk:  No deformity or scoliosis noted of thoracic or lumbar spine.   Pulses:  R and  L carotid,radial,femoral,dorsalis pedis and posterior tibial pulses are full and equal bilaterally Extremities:  No clubbing, cyanosis, edema, or deformity noted with normal full range of motion of all joints.   Neurologic:  No cranial nerve deficits noted. Station and gait are normal. Plantar reflexes are down-going bilaterally. DTRs are symmetrical throughout. Sensory, motor and coordinative functions appear intact. Skin:  Intact without suspicious lesions or rashes Cervical Nodes:  No lymphadenopathy noted Axillary Nodes:  No palpable lymphadenopathy Inguinal Nodes:  No significant adenopathy Psych:  Oriented X3, memory intact for recent and remote, normally interactive, good eye contact, not anxious appearing, and tearful.     Impression & Recommendations:  Problem # 1:  MIGRAINE UNSP W/O INTRACT W/O STATUS MIGRAINOSUS (ICD-346.90) Assessment New  No "red flags." Likely, depression-related. Headache diary requested. Patient is to use Acetaminophen or Ibuprofen OTC as needed. Patient is to call ASAP if headaches worsen. Instructed to call 911 or go to ER if feels worse.   Problem # 2:  DEPRESSION (ICD-311) Assessment: New  Situational due to poor relationship with spouse and children. Will start SSRI and refer to a psychologist. Spend 50 minutes in a discussion of depression, etiology, risk factors; treatment. Regular daily physical exercise and balanced diet advised. Will f/u in 4 weeks. Unable to check TSH, CBC, C-met due to patient's lack of insurance. Patient requested to defer lab tests. Her updated medication list for this problem includes:    Fluoxetine Hcl 20 Mg Caps (Fluoxetine hcl) .Marland Kitchen... Take one tablet by mouth qhs  Orders: Social Work Referral (Social )  Discussed treatment options, including trial  of antidpressant medication. Will refer to behavioral health. Follow-up call in in 24-48 hours and recheck in 2 weeks, sooner as needed. Patient agrees to call if any worsening  of symptoms or thoughts of doing harm arise. Verified that the patient has no suicidal ideation at this time.   Complete Medication List: 1)  Fluoxetine Hcl 20 Mg Caps (Fluoxetine hcl) .... Take one tablet by mouth qhs  Other Orders: Influenza Vaccine NON MCR (81191) Tdap => 59yrs IM (47829) Admin 1st Vaccine ([address]213)  Patient Instructions: 1)  Please, make an appointment with Georges Mouse for depression counselling. 2)  Please, make an appointment with Chaunce[facility] Reading for a financial assistance. 3)  Once you have a [place] "orange card," we will be able to refer you for a mammogram, have blood tests done, etc. 4)  Please, call with any questions. 5)  Go to ER or call 911 if you feel suicidal or feeling like hurting anyone. 6)  Please, follow up in 4 weeks or sooner. Prescriptions: FLUOXETINE HCL 20 MG CAPS (FLUOXETINE HCL) Take one tablet by mouth qhs  #30 x 6   Entered and Authorized by:   Deatra Robinson MD   Signed by:   Deatra Robinson MD on 04/25/2010   Method used:   Electronically to        [facility] [phone]* (retail)       [address]       [place], [place]  [address]       Ph: [phone]       Fax: [phone]   RxID:   [id number]   Prevention & Chronic Care Immunizations   Influenza vaccine: Fluvax Non-MCR  (04/25/2010)    Tetanus booster: 04/25/2010: Tdap    Pneumococcal vaccine: Not documented  Other Screening   Pap smear: Not documented    Mammogram: Not documented   Smoking status: never  (04/25/2010)  Lipids   Total Cholesterol: Not documented   LDL: Not documented   LDL Direct: Not documented   HDL: Not documented   Triglycerides: Not documented      Resource handout printed.     Tetanus/Td Vaccine    Vaccine Type: Tdap    Site: left deltoid    Mfr: GlaxoSmithKline    Dose: 0.5 ml    Route: IM    Given by: Chinita Pester RN    Exp. Date: 04/12/2012    Lot #: [id number]    VIS given:  06/09/08 version given April 25, 2010.  Influenza Vaccine    Vaccine Type: Fluvax Non-MCR    Site: right deltoid    Mfr: GlaxoSmithKline    Dose: 0.5 ml    Route: IM    Given by: Chinita Pester RN    Exp. Date: 01/20/2011    Lot #: [id number]    VIS given: 02/14/10 version given April 25, 2010.  Flu Vaccine Consent Questions    Do you have a history of severe allergic reactions to this vaccine? no    Any prior history of allergic reactions to egg and/or gelatin? no    Do you have a sensitivity to the preservative Thimersol? no    Do you have a past history of Guillan-Barre Syndrome? no    Do you currently have an acute febrile illness? no    Have you ever had a severe reaction to latex? no    Vaccine information given and explained to patient?  yes    Are you currently pregnant? no

## 2010-08-24 NOTE — Assessment & Plan Note (Signed)
Summary: Heidi Galvan/2 WEEK RECHECK/CH   Vital Signs:  Patient profile:   45 year old female Height:      58.5 inches Weight:      165.5 pounds BMI:     34.12 Temp:     97.4 degrees F oral Pulse rate:   82 / minute BP sitting:   133 / 86  (right arm)  Vitals Entered By: Filomena Jungling NT II (August 08, 2010 9:59 AM) CC: FOLLOW-up visit/ DEPRESSION Is Patient Diabetic? No Pain Assessment Patient in pain? no      Nutritional Status BMI of > 30 = obese  Have you ever been in a relationship where you felt threatened, hurt or afraid?No   Does patient need assistance? Functional Status Self care Ambulation Normal   CC:  FOLLOW-up visit/ DEPRESSION.  History of Present Illness: 45 yr old woman with pmhx as described in the EMR comes to the clinic for 2 week f/u of sinus drainage associated with headache, myalgias, dry cough and sinus pressure with fever of 103.  She has now completed a 10 day course of doxycycline.  SHe is feeling a little bit better but not 100%.  She is still experiencing sinus pressure and congestion.  She has had 2 fevers of 102 since completing her antibiotics.  She is also reporting a cough now productive of green mucous.   SHe also reports chest pain that worsens with breathing.   She denies ear pain and admits to occasional sore throat, particularly at night associated with thirst.  SHe denies sick contacts or recent travel.   She reports some dysuria over the past week; denies hematuria and flank pain.  She reports increased stress at home 2/2 recent separation from her husband, difficulty with her teenage children, and  financial worries.  She is taking her prozac without adverse effect and feels this has slightly helped her mood.  She is interested in talking with someone about her problems; she feels she has little support at home as she is responsible for helping everyone else.  Denies DI/HI  Depression History:      The patient denies a depressed mood  most of the day and a diminished interest in her usual daily activities.         Preventive Screening-Counseling & Management  Alcohol-Tobacco     Alcohol drinks/day: 0     Smoking Status: never  Caffeine-Diet-Exercise     Does Patient Exercise: yes     Type of exercise: DVD's/weights  Current Medications (verified): 1)  Fluoxetine Hcl 40 Mg Caps (Fluoxetine Hcl) .... Take One Tablet By Mouth Daily 2)  Naproxen 500 Mg Tabs (Naproxen) .... Take One Tablet By Mouth Two Times A Day As Needed 3)  Ferrous Fumarate 325 (106 Fe) Mg Tabs (Ferrous Fumarate) .... Take One Tablet Three Times A Day With Orange Juice For Anemia 4)  Sulfamethoxazole-Trimethoprim 800-160 Mg/28ml Susp (Sulfamethoxazole-Trimethoprim) .... Take 1 Tablet By Mouth Two Times A Day For 7 Days 5)  Flonase 50 Mcg/act Susp (Fluticasone Propionate) .... 2 Sprays/nostril Per Day 6)  Sudafed Pe Maximum Strength 10 Mg Tabs (Phenylephrine Hcl) .... Take By Mouth 1-2 Tablets Every 4 Hours As Needed For Nasal Congestion  Allergies (verified): 1)  ! Penicillin  Past History:  Past medical, surgical, family and social histories (including risk factors) reviewed for relevance to current acute and chronic problems.  Past Medical History: Reviewed history from 04/25/2010 and no changes required. Depression  Family History: Reviewed history from 04/25/2010  and no changes required. Sister->depression  Social History: Reviewed history from 04/25/2010 and no changes required. unemployed; uninsured no nicotine, ETOH, or illicit drug use. high school level education separated from her husband.  Physical Exam  General:  NAD, vitals reviewed  well-developed, well-nourished, and well-hydrated.   Head:  Normocephalic and atraumatic without obvious abnormalities. No apparent alopecia or balding. Eyes:  vision grossly intact.  EOMI. PERRLA.  Sclerae anicteric.   Mouth:  no pharyngeal erythema, no exudates. pharynx pink and moist.     Neck:  supple, full ROM, and no masses.   Lungs:  normal respiratory effort, no accessory muscle use, normal breath sounds, no crackles, and no wheezes.   No ronchi.  No dullness to percussion Heart:  Normal rate and regular rhythm. S1 and S2 normal without gallop, murmur, click, rub or other extra sounds. Abdomen:  Bowel sounds positive,abdomen soft and non-tender without masses, organomegaly or hernias noted. Msk:  no joint tenderness, no joint swelling, and no redness over joints.   Neurologic:  alert & oriented X3, cranial nerves II-XII intact, and gait normal.  strength normal in all extremities and sensation intact to light touch.   Skin:  turgor normal, color normal, and no rashes.   Psych:  Oriented X3, memory intact for recent and remote, normally interactive, good eye contact, not anxious appearing, and not depressed appearing.     Impression & Recommendations:  Problem # 1:  FEVER, RECURRENT (ICD-087.9) I believe pts fever may reflect persistent sinus infection.  PNA is also possible, as she reports interval development of a productive cough and mild pleurtic chest pain; however she has no findings on exam to suggest PNA.   UTI is also possible, given her hx of dysurai; will check U/A today to eval for other sources of her fever.  WIll treat her with another course of abx; a 7 day course of Bactrim should provide sufficient overage for upper respiratory organisms as well as common urinary tract pathogens.  Discussed the risk/benefits of obtaining a CXR to eval for PNA; pt declines CXR at this time - I feel this is reasonable given her lack of examination findings to suggest PNA and CXR would be unlikely to alter managment at this time.    Pt is advised to f/u if her symptoms to not improve in 2 weeks.  At that time, if she is still febrile and symptomatic; would consider CT head to eval for possible loculated sinus infection.  WOuld also reconsider CXR at that time.  Orders: T-Culture,  Urine ([id number]) T-Urinalysis ([id number])  Problem # 2:  DYSURIA (ICD-788.1) May represent source of pt persistent fever.  WIll check U/A/.   Please refer to assessment/plan outlined in #1.  Her updated medication list for this problem includes:    Sulfamethoxazole-trimethoprim 800-160 Mg/64ml Susp (Sulfamethoxazole-trimethoprim) .Marland Kitchen... Take 1 tablet by mouth two times a day for 7 days  Orders: T-Culture, Urine ([id number]) T-Urinalysis ([id number])  Problem # 3:  DEPRESSION (ICD-311) Pt denies SI/HI and is tolerating prozac well.  WIll refer her to [facility] to discuss mental health referral and local mental health resources.  Her updated medication list for this problem includes:    Fluoxetine Hcl 40 Mg Caps (Fluoxetine hcl) .Marland Kitchen... Take one tablet by mouth daily  Orders: Social Work Referral (Social )  Problem # 4:  ACUTE FRONTAL SINUSITIS (ICD-461.1) WIll treat with an additonal course of abx.  Please refer to plan outlined in problem #1 for more information.  Her updated medication list for this problem includes:    Sulfamethoxazole-trimethoprim 800-160 Mg/18ml Susp (Sulfamethoxazole-trimethoprim) .Marland Kitchen... Take 1 tablet by mouth two times a day for 7 days    Flonase 50 Mcg/act Susp (Fluticasone propionate) .Marland Kitchen... 2 sprays/nostril per day    Sudafed Pe Maximum Strength 10 Mg Tabs (Phenylephrine hcl) .Marland Kitchen... Take by mouth 1-2 tablets every 4 hours as needed for nasal congestion   >32min with at least 50% face to face time spent couseling pt and coordinating care with other providers.  Complete Medication List: 1)  Fluoxetine Hcl 40 Mg Caps (Fluoxetine hcl) .... Take one tablet by mouth daily 2)  Naproxen 500 Mg Tabs (Naproxen) .... Take one tablet by mouth two times a day as needed 3)  Ferrous Fumarate 325 (106 Fe) Mg Tabs (Ferrous fumarate) .... Take one tablet three times a day with orange juice for anemia 4)  Sulfamethoxazole-trimethoprim 800-160 Mg/30ml Susp  (Sulfamethoxazole-trimethoprim) .... Take 1 tablet by mouth two times a day for 7 days 5)  Flonase 50 Mcg/act Susp (Fluticasone propionate) .... 2 sprays/nostril per day 6)  Sudafed Pe Maximum Strength 10 Mg Tabs (Phenylephrine hcl) .... Take by mouth 1-2 tablets every 4 hours as needed for nasal congestion  Patient Instructions: 1)  Please schedule a follow-up appointment as needed. 2)  If your symptoms do not get better in 2 weeks, schedule an appt to be seen in clinic. 3)  Take you antibiotics as directed. 4)  Use the information Hanley Seamen gives you to find someone to talk to about stress. Prescriptions: SULFAMETHOXAZOLE-TRIMETHOPRIM 800-160 MG/20ML SUSP (SULFAMETHOXAZOLE-TRIMETHOPRIM) Take 1 tablet by mouth two times a day for 7 days  #14 x 0   Entered and Authorized by:   Nelda Bucks DO   Signed by:   Nelda Bucks DO on 08/08/2010   Method used:   Faxed to ...       [facility] Department (retail)       [address] [place], [place]  [address]       Ph: [phone]       Fax: [phone]   RxID:   [id number] SULFAMETHOXAZOLE-TRIMETHOPRIM 800-160 MG/20ML SUSP (SULFAMETHOXAZOLE-TRIMETHOPRIM) Take 1 tablet by mouth two times a day for 7 days  #14 x 0   Entered and Authorized by:   Nelda Bucks DO   Signed by:   Nelda Bucks DO on 08/08/2010   Method used:   Print then Give to Patient   RxID:   [id number]    Orders Added: 1)  T-Culture, Urine [[id number]] 2)  Social Work Referral [facility] ] 3)  T-Urinalysis [[id number]] 4)  Est. Patient Level IV [84166]    Prevention & Chronic Care Immunizations   Influenza vaccine: Fluvax Non-MCR  (04/25/2010)    Tetanus booster: 04/25/2010: Tdap    Pneumococcal vaccine: Not documented  Other Screening   Pap smear: Not documented    Mammogram: ASSESSMENT: Negative - BI-RADS 1^MM DIGITAL SCREENING  (07/05/2010)   Smoking status: never  (08/08/2010)  Lipids   Total Cholesterol: 194   (05/01/2010)   LDL: 113  (05/01/2010)   LDL Direct: Not documented   HDL: 56  (05/01/2010)   Triglycerides: 127  (05/01/2010)   Process Orders Check Orders Results:     [facility]: ABN not required for this insurance Tests Sent for requisitioning (August 09, 2010 1:43 PM):     08/08/2010: [facility] -- T-Culture, Urine [[id number]] (signed)  08/08/2010: [facility] -- T-Urinalysis [[id number]] (signed)

## 2010-08-24 NOTE — Patient Instructions (Signed)
Please return to clinic tomorrow. Go get CT done now.

## 2010-08-24 NOTE — Miscellaneous (Signed)
Summary: PATIENT CONSENT FORM  PATIENT CONSENT FORM   Imported By: Louretta Parma 04/27/2010 15:55:28  _____________________________________________________________________  External Attachment:    Type:   Image     Comment:   External Document

## 2010-08-24 NOTE — Progress Notes (Signed)
  Subjective:    Patient ID: Heidi Galvan, female    DOB: 1966-02-27, 45 y.o.   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 Aggrevated with lifting. Pain feels pressure like. Pain radiates to the back.  Associated with some vaginal secretions (greenish) with lifting, dysuria, vaginal itching.  Reports that she has a fever of 103 two nights ago and last week have fever of 102 and 103 F.  Patient finished taking bactrim 1 week ago. Reports to have constipation. Last BM was 2 days ago.   Patient saw Gynecologist after having ultrasound studies done. Pap smear was done last month which she reports to have been normal. Recommendations by Gynecologist was observation per patient.  Denies n/v/diarrhea, or weight loss  Sinusitis resolved.   Review of Systems 12 point system reviewed. Otherwise denies as per hpi.    Objective:   Physical Exam  Vitals reviewed. Constitutional: She is oriented to person, place, and time. She appears well-nourished. No distress.  Eyes: EOM are normal.  Neck: Normal range of motion. Neck supple.  Cardiovascular: Normal rate and regular rhythm.   Pulmonary/Chest: Effort normal and breath sounds normal. No respiratory distress.  Abdominal: Soft. Bowel sounds are normal. She exhibits no distension. There is no rebound and no guarding.       ttp right lower quadrant  Genitourinary:       Patient refused.  Musculoskeletal: Normal range of motion.  Neurological: She is alert and oriented to person, place, and time. No cranial nerve deficit.  Psychiatric: She has a normal mood and affect.          Assessment & Plan:  1) Right lower quadrant pain:  Differential includes appendicitis, ovarian abscess, ovarian mass. Order CBC w/ diff, CT abdomen/pelvis. Will review and reassess.  Results of imaging will be called to senior resident on call and assessment for hospitalization will be done. If no hospitalization required will have patient follow up in clinic tomorrow to further discuss imaging results and management. Will get records from Gynecologist of Pap smear and inquire about need for endometrial biopsy.  2) Dysuria:  No evidence of UTI.   3) Sinusitis:  Resolved.

## 2010-08-24 NOTE — Assessment & Plan Note (Signed)
Summary: Soc. Work  15 minutes.  Patient is seeking counseling for her depression.  I have explained the services offered by [facility] and where they are located.  The patient said that she is familiar with that resource as her daughter goes there.   The patient is receptive to my calling over there to make a referral for intake and counseling.  I also explained to the patient that they have Spanish speaking therapists if that is desired.  Today she spoke to me in [place].   SW follow-up.

## 2010-08-24 NOTE — Miscellaneous (Signed)
Summary: OB/GYN referral  Clinical Lists Changes  Orders: Added new Referral order of Gynecologic Referral (Gyn) - Signed

## 2010-08-25 ENCOUNTER — Telehealth: Payer: Self-pay | Admitting: *Deleted

## 2010-08-25 ENCOUNTER — Ambulatory Visit (INDEPENDENT_AMBULATORY_CARE_PROVIDER_SITE_OTHER): Payer: Self-pay | Admitting: Internal Medicine

## 2010-08-25 DIAGNOSIS — R1031 Right lower quadrant pain: Secondary | ICD-10-CM

## 2010-08-25 NOTE — Telephone Encounter (Signed)
Call to pt to inform her that she will not need to come for the appointment today.   CT showed no significant problems. Will ask Dr. Cena Benton to call pt to explain findings today instead of pt coming in for an appointment.

## 2010-08-25 NOTE — Telephone Encounter (Signed)
Tried calling the patient to discuss results of CT abdomen/pelvis. Left message to call me in the office. Will try to contact patient again.

## 2010-08-28 ENCOUNTER — Other Ambulatory Visit: Payer: Self-pay | Admitting: Internal Medicine

## 2010-08-28 DIAGNOSIS — R1031 Right lower quadrant pain: Secondary | ICD-10-CM

## 2010-08-29 ENCOUNTER — Telehealth: Payer: Self-pay | Admitting: *Deleted

## 2010-08-29 NOTE — Telephone Encounter (Signed)
A user error has taken place: encounter opened in error, closed for administrative reasons.

## 2010-08-29 NOTE — Telephone Encounter (Signed)
Please see telephone encounter of 08/25/10

## 2010-08-31 ENCOUNTER — Ambulatory Visit: Payer: Self-pay | Admitting: Internal Medicine

## 2010-09-01 ENCOUNTER — Telehealth: Payer: Self-pay | Admitting: *Deleted

## 2010-09-01 NOTE — Telephone Encounter (Signed)
Pt. Asked about an appt. At [facility] (Ob/GYN).  I looked at Appointments; she has an appt. Already scheduled with Dr. Okey Dupre 09/14/10 at 3:00PM.  Information given to pt.

## 2010-09-14 ENCOUNTER — Encounter: Payer: Self-pay | Admitting: Obstetrics and Gynecology

## 2010-09-14 ENCOUNTER — Encounter: Payer: PRIVATE HEALTH INSURANCE | Admitting: Obstetrics and Gynecology

## 2010-09-14 DIAGNOSIS — N949 Unspecified condition associated with female genital organs and menstrual cycle: Secondary | ICD-10-CM

## 2010-09-14 DIAGNOSIS — N925 Other specified irregular menstruation: Secondary | ICD-10-CM

## 2010-09-14 LAB — CONVERTED CEMR LAB
Platelets: 283 10*3/uL (ref 150–400)
RDW: 14.9 % (ref 11.5–15.5)
Trich, Wet Prep: NONE SEEN
WBC: 7.7 10*3/uL (ref 4.0–10.5)
Yeast Wet Prep HPF POC: NONE SEEN

## 2010-09-22 ENCOUNTER — Telehealth: Payer: Self-pay | Admitting: *Deleted

## 2010-09-22 NOTE — Telephone Encounter (Signed)
Message copied by Angelina Ok on Fri Sep 22, 2010 12:10 PM ------      Message from: Laren Everts      Created: Fri Aug 25, 2010  4:27 PM      Regarding: Ob/Gyn visit       Hello Tad Fancher:            Can you make an appointment with Dr. Tenny Craw for this patient. The patient just saw Dr. Tenny Craw (Ob/Gyn) last month but I would like to have her return for further evaluation and management of symptomatic hydrosalpinges and question of endometrial biopsy because of evidence of endometrial thickening on ultrasound. I would like her to be seen within the next week if possible. Please inform the patient of the appointment. Thank you.            Dr. Cena Benton

## 2010-09-22 NOTE — Telephone Encounter (Signed)
Pt was called and has been scheduled with Dr. Okey Dupre at the [facility].

## 2010-09-25 ENCOUNTER — Telehealth: Payer: Self-pay | Admitting: *Deleted

## 2010-09-25 NOTE — Telephone Encounter (Signed)
Pt called stating she has fever 102 and not feeling well. Headache and sore throat. Cough  And back pain. Onset 2 days ago. Using tylenol with some relief but not able to sleep.  We have no appointments today or tomorrow. Pt sent to [facility] for evaulation.

## 2010-09-27 ENCOUNTER — Ambulatory Visit (INDEPENDENT_AMBULATORY_CARE_PROVIDER_SITE_OTHER): Payer: Self-pay | Admitting: Ophthalmology

## 2010-09-27 ENCOUNTER — Encounter: Payer: Self-pay | Admitting: Ophthalmology

## 2010-09-27 DIAGNOSIS — A689 Relapsing fever, unspecified: Secondary | ICD-10-CM

## 2010-09-27 DIAGNOSIS — J0110 Acute frontal sinusitis, unspecified: Secondary | ICD-10-CM

## 2010-09-27 MED ORDER — AZITHROMYCIN 500 MG PO TABS: 500.0000 mg | ORAL_TABLET | Freq: Every day | ORAL | Status: AC

## 2010-09-27 NOTE — Assessment & Plan Note (Addendum)
The patient appears to have recurrent acute frontal sinusitis. The patient has experienced productive cough though I suspect that this is the results of her postnasal drip. I did explain to the patient that the differential diagnosis for this did include pneumonia I think it is less likely at this time given normal pulmonary exam , no subjective shortness of breath. Given the patient's allergic to penicillin, I will treat a 3 day course of azithromysin. I will also check a maxillofacial CT to evaluate the sinuses.  If pt continues to have recurrent sinusitis, she will need to fu with an ENT.

## 2010-09-27 NOTE — Progress Notes (Signed)
Subjective:    Patient ID: Heidi Galvan, female    DOB: 1965-11-14, 45 y.o.   MRN: [id number]  HPI  This is a 45 year old female with a past medical history significant for migraines  and ovarian mass seen in 2008 on CT scan who presents for acute visit for productive cough and back pain.   The patient was last seen by Dr. Cena Benton on February 2 for groin pain a CT of the abdomen was performed which showed no acute findings. The patient had also endorsed fevers at that time. The patient was to followup on the following day but did not do so. This pain has now primarily resolved.    Since that time, the patient endorses intermittent fevers to 102-103. This occurred approximately two times per week and the patient takes Tylenol for the symptoms.  The patient's fever is associated with increased maxillary and frontal sinus pressure , nasal drainage , and cough productive of green sputum. The patient also endorses a headache during this time that's frontal  And moderate in intensity. The patient states that she's had multiple sinus infections in the past and has been treated with antibiotics but generally clear her symptoms , however the patient later redeveloped symptoms. The patient has noted pain in her upper teeth. The patient also in dorsal muscle pain across her posterior back.      Review of Systems  Constitutional: Positive for fever. Negative for chills.  Respiratory: Positive for cough. Negative for shortness of breath.   Cardiovascular: Negative for chest pain and palpitations.  Gastrointestinal: Negative for vomiting, diarrhea and constipation.       Objective:   Physical Exam  Constitutional: She appears well-developed and well-nourished.  HENT:  Head: Normocephalic and atraumatic.        The patient has boggy turbinates bilaterally, tenderness to palpation over the maxillary and frontal sinuses , but does not have any oropharyngeal erythema.  Eyes: Pupils are equal, round, and  reactive to light.        Funduscopic exam is limited by pupil size , but has not had any papilledema.  Cardiovascular: Normal rate, regular rhythm and intact distal pulses.  Exam reveals no gallop and no friction rub.   No murmur heard. Pulmonary/Chest: Effort normal and breath sounds normal. She has no wheezes. She has no rales.  Abdominal: Soft. Bowel sounds are normal. She exhibits no distension. There is no tenderness.  Musculoskeletal: Normal range of motion.  Neurological: She is alert. No cranial nerve deficit.  Skin: No rash noted.        Current Outpatient Prescriptions on File Prior to Visit  Medication Sig Dispense Refill  . ferrous fumarate (HEMOCYTE) 325 (106 FE) MG TABS Take 1 tablet by mouth 3 (three) times daily. With orange juice for anemia       . FLUoxetine (PROZAC) 40 MG capsule Take 40 mg by mouth daily.        . fluticasone (FLONASE) 50 MCG/ACT nasal spray 2 sprays daily. In each nostril       . naproxen (NAPROSYN) 500 MG tablet Take 500 mg by mouth 2 (two) times daily as needed.        . phenylephrine (SUDAFED PE) 10 MG TABS Take 10-20 mg by mouth every 4 (four) hours as needed. For nasal congestion         Past Medical History  Diagnosis Date  . Menometrorrhagia   . Migraine   . Depression   . Iron deficiency anemia  Assessment & Plan:

## 2010-09-27 NOTE — Patient Instructions (Signed)
Please purchase over-the-counter Sudafed at the pharmacy and take it as prescribed on the box. I also want you to use your Flonase every day as prescribed. I am going to do a CT scan of your sinuses to further evaluate your sinusitis. Please followup with her primary doctor in the next 1-2 months.

## 2010-10-02 ENCOUNTER — Encounter: Payer: Self-pay | Admitting: *Deleted

## 2010-10-02 ENCOUNTER — Encounter: Payer: Self-pay | Admitting: Internal Medicine

## 2010-10-02 ENCOUNTER — Ambulatory Visit (INDEPENDENT_AMBULATORY_CARE_PROVIDER_SITE_OTHER): Payer: Self-pay | Admitting: Internal Medicine

## 2010-10-02 ENCOUNTER — Ambulatory Visit (HOSPITAL_COMMUNITY)
Admission: RE | Admit: 2010-10-02 | Discharge: 2010-10-02 | Disposition: A | Discharge status: Home / Self Care | Payer: Self-pay | Source: Ambulatory Visit | Attending: Internal Medicine | Admitting: Internal Medicine

## 2010-10-02 VITALS — BP 134/90 | HR 93 | Temp 97.0°F | Wt 172.7 lb

## 2010-10-02 DIAGNOSIS — J329 Chronic sinusitis, unspecified: Secondary | ICD-10-CM

## 2010-10-02 DIAGNOSIS — J0110 Acute frontal sinusitis, unspecified: Secondary | ICD-10-CM | POA: Insufficient documentation

## 2010-10-02 MED ORDER — AMOXICILLIN 500 MG PO CAPS: 500.0000 mg | ORAL_CAPSULE | Freq: Three times a day (TID) | ORAL | Status: AC

## 2010-10-02 MED ORDER — GUAIFENESIN-DM 100-10 MG/5ML PO SYRP: 5.0000 mL | ORAL_SOLUTION | Freq: Three times a day (TID) | ORAL | Status: AC | PRN

## 2010-10-02 MED ORDER — FLUTICASONE PROPIONATE 50 MCG/ACT NA SUSP: 1.0000 | Freq: Every day | NASAL | Status: DC

## 2010-10-02 NOTE — Patient Instructions (Signed)
Sinusitis Sinusitis is a sinus infection. Sinuses are air pockets in your face. Germs (bacteria) growing in a sinus can lead to infection. This can cause puffiness (swelling). It can also cause drainage from your sinuses. This can happen in one or more of your sinuses. HOME CARE  Only take medicine as told by your doctor.   Drink enough water and fluids to keep your pee (urine) clear or pale yellow.   Apply moist heat or ice packs for pain.   Use saline nasal sprays. The spray will wet the thick fluid in the nose. This can help the sinuses drain. You can find these sprays at a drugstore.  TREATMENT After an exam, your doctor may give:  Medicine (antibiotic) that kills the infection.   Medicine (decongestant) to shrink puffiness.  GET HELP RIGHT AWAY IF:  You or your child has a temperature by mouth above 101, not controlled by medicine.   Your baby is older than 3 months with a rectal temperature of 102 F (38.9 C) or higher.   Your baby is 3 months old or younger with a rectal temperature of 100.4 F (38 C) or higher.   The pain gets worse.   You or your child gets a very bad headache.   You or your child keeps throwing up (vomiting).   The face gets puffy.  MAKE SURE YOU:  Understand these instructions.   Will watch this condition.   Will get help right away if you or your child is not doing well or gets worse.  Document Released: 12/26/2007 Document Re-Released: 10/03/2009 Exit[employer] Patient Information 2011 [employer]. 

## 2010-10-02 NOTE — Progress Notes (Signed)
  Subjective:    Patient ID: Heidi Galvan, female    DOB: [birth year], 45 y.o.   MRN: [id number]  HPI   patient presented to the clinic  On 27 September 2010 with complaints of sinus pain,  Was consistent with sinusitis the patient was treated with 3 day course  Zithromax, CT of the sinuses were obtained and was consistent with acute sinusitis.  Since then the patient has not improved complains of persistent congestion and headaches  With some mild coughing.  She denies any fever chills nausea vomiting.  Review of Systems  All other systems reviewed and are negative.       Objective:   Physical Exam  Constitutional: She is oriented to person, place, and time. She appears well-developed and well-nourished.  HENT:  Head: Normocephalic and atraumatic.  Mouth/Throat: Oropharynx is clear and moist.       Sinus tenderness  Eyes: Conjunctivae are normal. Pupils are equal, round, and reactive to light.  Neck: Normal range of motion. Neck supple.  Cardiovascular: Normal rate, regular rhythm and normal heart sounds.   Pulmonary/Chest: Effort normal and breath sounds normal.  Abdominal: Soft. Bowel sounds are normal.  Neurological: She is alert and oriented to person, place, and time.          Assessment & Plan:

## 2010-10-02 NOTE — Progress Notes (Signed)
Pt was diagnosed with sinusitis during last office visit.  Her CT is consistent with sinusitis in setting of her symptoms.  She has only had about 1 week worth of symptoms and do not feel she needs antibiotics, though given 3 days of zithromax.  At this time, I think it is more important to continue with supportive therapy:  Analgesics (tylenol) for discomfort, intranasal corticosteroids (fluticasone) and decongestant (phenylephrine);  All of which she already has.  Per Dr. Ovidio Kin note, if symptoms persist he recommended that patient see an ENT.    I will route to Dr. Cathey Endow in case he feels an ENT referral should be done, otherwise continue with management mentioned above.  Patient should call back in not better within the next week despite symptomatic treatment mentioned above.

## 2010-10-02 NOTE — Progress Notes (Signed)
Pt has not felt well for 1 week, she was seen in clinic and took  Zithromax for 3 days and sudafed. She had CT of sinuses done today.   She has productive cough, feels tired.  Still not better.

## 2010-10-02 NOTE — Progress Notes (Signed)
Pt was put in an open slot to be seen today

## 2010-10-02 NOTE — Assessment & Plan Note (Signed)
Patient was recently seen and treated for sinusitis in the clinic with a three-day course of  Azithromycin,  Head CT was obtained and showed   Widespread paranasal sinus mucosal thickening with small fluid  level the left maxillary sinus could reflect acute sinusitis in the appropriate clinical setting.  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  However despite treatment with Zithromax, the patient has not improved clinically,  Given this I will give the patient amoxicillin for 14 days,  And symptomatic relief via fluticasone nasal spray and dextromethorphan cough suppressant.   note the patient has an allergy to penicillin however on questioning the allergies very remote and very mild rash on her wrist,  Given this I will proceed with prescribing her amoxicillin,  I have informed the patient that if she develops any reaction to stop taking the medication immediately and contact.

## 2010-10-03 NOTE — Progress Notes (Signed)
I agree with Dr. Augusto Gamble plan.  Pt does not require ENT referral at this time though this could be considered in the future.

## 2010-10-12 ENCOUNTER — Ambulatory Visit: Payer: Self-pay | Admitting: Obstetrics & Gynecology

## 2010-10-12 DIAGNOSIS — N920 Excessive and frequent menstruation with regular cycle: Secondary | ICD-10-CM

## 2010-10-12 DIAGNOSIS — Z01818 Encounter for other preprocedural examination: Secondary | ICD-10-CM

## 2010-10-13 NOTE — Progress Notes (Unsigned)
NAME:  Heidi Galvan, Heidi Galvan           ACCOUNT NO.:  [id number]  MEDICAL RECORD NO.:  [id number]          PATIENT TYPE:  LOCATION:  [facility]                     FACILITY:  PHYSICIAN:  Argentina Donovan, MD        DATE OF BIRTH:  [birth year]  DATE OF SERVICE:  09/14/2010                                 CLINIC NOTE  The patient is a 45 year old English-speaking Hispanic female who had endometrial biopsy because of heavy periods.  The ultrasound showed apparent thickening of the endometrial canal.  Hysterosalpingogram was then done which showed a small polyp or fibroid within the uterus.  The problem in this patient is that her periods are extraordinarily heavy. She is home for an entire week.  She cannot go out because blood runs down her legs.  This has been going on for some time.  I think she needs a D&C with hysteroscopy and since that is going to be done.  I have talked to her about an ablation which she has agreed also to do.  She was referred back here by her primary care physician.  She has also been complaining of a very heavy watery discharge and a vaginal odor, so we did a wet prep today.  IMPRESSION:  Normal-sized uterus with slight endometrial defect. Endometrial biopsy and Pap smear were okay.  The patient to see surgeon for [facility], hysteroscopy, and possible endometrial ablation.          ______________________________ Argentina Donovan, MD    PR/MEDQ  D:  09/14/2010  T:  09/15/2010  Job:  [id number]

## 2010-10-17 ENCOUNTER — Encounter: Payer: Self-pay | Admitting: Internal Medicine

## 2010-10-26 NOTE — Progress Notes (Signed)
NAMEAARNA, MIHALKO           ACCOUNT NO.:  [id number]  MEDICAL RECORD NO.:  [id number]           PATIENT TYPE:  LOCATION:  [facility]                     FACILITY:  PHYSICIAN:  Jaynie Collins, MD     DATE OF BIRTH:  Jan 13, 1966  DATE OF SERVICE:  10/12/2010                                 CLINIC NOTE  REASON FOR VISIT:  Surgical consult.  Heidi Galvan is a 45 year old Hispanic gravida 3, para 3 who has been followed for menorrhagia.  The patient had a pelvic ultrasound in November 2011 which showed a 9-week-sized uterus with focal prominence in the endometrium measuring 2.1 cm, normal adnexa bilaterally.  A followup hysterosalpingogram that was done in January did not show any focal filling defect.  The endometrial thickness was seen to measure 1.2 cm and the impression was of a diffuse homogeneous thickening of the endometrium without polypoid filling defect to suggest polyp or fibroid. Of note, the patient also had an endometrial biopsy that was done on July 05, 2010, which was benign secretory endometrium.  No hyperplasia or malignancy identified and she also had a negative Pap smear on the same day.  The patient has been counseled regarding her options and she wants to proceed with an endometrial ablation.  She has no other concerns apart from her heavy menstrual periods and she is back in for her ablation to be done prior to her next period which is expected around April 12.  Her last menstrual period was on October 02, 2010.  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  PAST SURGICAL HISTORY:  Laparoscopic bilateral tubal ligation in 1998 and also laparoscopic cholecystectomy in 2008.  PAST OBSTETRIC AND GYNECOLOGIC HISTORY:  The patient has had 3 vaginal deliveries, normal Pap smears.  Menstrual history is remarkable for menorrhagia.  She does not have any other gynecologic conditions.  MEDICATIONS:  Naproxen, ferrous fumarate, and  amoxicillin for a recent sinus infection.  The patient also takes Zoloft.  ALLERGIES:  PENICILLIN which causes swelling.  However, the patient was prescribed amoxicillin and she says she did have facial swelling the first day she took amoxicillin but then subsequently she has been fine.  SOCIAL HISTORY:  The patient lives with her family.  She denies any habits.  FAMILY HISTORY:  Remarkable only for high blood pressure.  No gynecologic, colon or breast cancer history.  REVIEW OF SYSTEMS:  Only remarkable for menorrhagia.  PHYSICAL EXAMINATION:  VITAL SIGNS:  Temperature 97.5, pulse 76, blood pressure 112/72, weight 168.7 pounds, height 5 feet 9 inches in no apparent distress. GENERAL:  No apparent distress. LUNGS:  Clear to auscultation bilaterally. HEART:  Regular rate and rhythm. ABDOMEN:  Soft, nontender, nondistended. PELVIC:  Deferred. EXTREMITIES:  No cyanosis, clubbing or edema.  ASSESSMENT AND PLAN:  The patient is a 45 year old gravida 3, para 3 Hispanic female with menorrhagia who is desiring an endometrial ablation.  The patient does not have any focal lesions of the endometrium and has a regular uterine size.  She was counseled in regarding modes of endometrial ablation and wants to proceed with a hydrothermal ablation.  The risks of this procedure were explained  to the patient including but not limited to bleeding, infection, injury to the uterus or surrounding organs, need for additional procedures, possibility that this may not control her symptoms as she might end up with a hysterectomy and possible anesthetic complications.  All her questions were answered.  She was told that she will be contacted by the surgical scheduler regarding the time and date of her surgery and we will do our best to make sure that her ablation happens before her next menstrual period.  The patient was told to call or come back in for any further concerns and bleeding precautions were  reviewed.          ______________________________ Jaynie Collins, MD    UA/MEDQ  D:  10/12/2010  T:  10/13/2010  Job:  [id number]

## 2010-11-13 ENCOUNTER — Other Ambulatory Visit: Payer: Self-pay | Admitting: Obstetrics & Gynecology

## 2010-11-13 ENCOUNTER — Ambulatory Visit (HOSPITAL_COMMUNITY)
Admission: RE | Admit: 2010-11-13 | Discharge: 2010-11-13 | Disposition: A | Discharge status: Home / Self Care | Payer: Self-pay | Source: Ambulatory Visit | Attending: Obstetrics & Gynecology | Admitting: Obstetrics & Gynecology

## 2010-11-13 DIAGNOSIS — N920 Excessive and frequent menstruation with regular cycle: Secondary | ICD-10-CM

## 2010-11-13 LAB — CBC
MCH: 28.7 pg (ref 26.0–34.0)
MCHC: 33 g/dL (ref 30.0–36.0)
MCV: 87 fL (ref 78.0–100.0)
Platelets: 294 10*3/uL (ref 150–400)
RBC: 4.01 MIL/uL (ref 3.87–5.11)

## 2010-11-13 LAB — PREGNANCY, URINE: Preg Test, Ur: NEGATIVE

## 2010-11-21 ENCOUNTER — Encounter: Payer: Self-pay | Admitting: Internal Medicine

## 2010-11-21 ENCOUNTER — Ambulatory Visit (HOSPITAL_COMMUNITY): Admission: RE | Admit: 2010-11-21 | Payer: Self-pay | Source: Home / Self Care | Admitting: Internal Medicine

## 2010-11-28 ENCOUNTER — Encounter: Payer: Self-pay | Admitting: Internal Medicine

## 2010-12-01 NOTE — Op Note (Signed)
NAMEBREEONNA, MONE           ACCOUNT NO.:  [id number]  MEDICAL RECORD NO.:  [id number]           PATIENT TYPE:  O  LOCATION:  [facility]                          [facility]ACILITY[facility]  PHYSICIAN:  Horton Chin, MD DATE OF BIRTH:  05/27/1966  DATE OF PROCEDURE:  11/13/2010 DATE OF DISCHARGE:                              OPERATIVE REPORT   PREOPERATIVE DIAGNOSES:  Menorrhagia, fibroids.  POSTOPERATIVE DIAGNOSES:  Menorrhagia, fibroids.  PROCEDURES:  Hysteroscopy, dilation and curettage, and hydrothermal ablation.  SURGEON:  Horton Chin, MD  ANESTHESIOLOGIST:  Brayton Caves, MD  ANESTHESIA:  General and a paracervical block using 30 mL of 0.5% Marcaine.  IV FLUIDS:  1200 mL of lactated Ringer's.  ESTIMATED BLOOD LOSS:  Minimal.  INDICATIONS:  The patient is a 45 year old, gravida 3, para 3 with a long history of menorrhagia.  Also, recent ultrasound showed a 3-cm fundal fibroid and a possible submucosal polyp.  No submucosal fibroids were visualized on imaging.  The patient was counseled regarding her options of medical and surgical and she opted for hydrothermal ablation. Prior to surgery, the risks of the procedure were reviewed with the patient including but not limited to bleeding, infection, injury to the uterus or surrounding organs, need for additional procedures and written informed consent was obtained.  FINDINGS:  Diffuse proliferative tissue.  The uterus sounded to 11 cm. No submucosal lesions.  SPECIMENS:  Endometrial curettings which were sent to Pathology.  COMPLICATIONS:  None immediate.  PROCEDURE DETAILS:  The patient had sequential compression devices applied to her lower extremities and was taken to the operating room where general analgesia was administered and found to be adequate.  She was then placed in the dorsal lithotomy position and prepped and draped in a sterile manner.  Her bladder was catheterized for unmeasured amount of clear  yellow urine.  After an adequate time-out was performed, vaginal speculum was placed, her cervix was identified, and the tenaculum was placed on the anterior lip of the cervix.  A paracervical block was then administered.  Her cervix was dilated up to accommodate the hysteroscopic apparatus and the sharp curettage was then done to obtain a moderate amount of endometrial curettings and HTA hysteroscope was then advanced into the fundus.  The hydrothermal ablation was carried out according to protocol without any complications.  Good blanching effect was noted of the endometrium.  The fluid was cooled and the hysteroscopic apparatus was removed from the patient's uterus.  All instruments were also then removed from the patient's pelvis.  The patient did have small amount of tenaculum site bleeding which was ameliorated using pressure.  The patient tolerated the procedure well. Sponge, instrument, and needle counts were correct x2.  She was taken to recovery room, awake, extubated, and in stable condition.  DISPOSITION:  The patient was given routine discharge instructions after hysteroscopy and endometrial ablation.  She was also informed of her postoperative appointment with Dr. Macon Large on Dec 07, 2010, at 1:45 p.m. in the [facility]ologic Clinic.  The patient was given a prescription for Percocet, ibuprofen, and Colace, all 30 tablets each.  She was told to call the clinic or  come to the [facility] with any concerning postoperative signs or symptoms.     Horton Chin, MD     UAA/MEDQ  D:  11/13/2010  T:  11/13/2010  Job:  [id number]  Electronically Signed by Jaynie Collins MD on 12/01/2010 10:54:59 AM

## 2010-12-04 NOTE — Telephone Encounter (Signed)
Referral was sent to [facility].

## 2010-12-05 ENCOUNTER — Ambulatory Visit (INDEPENDENT_AMBULATORY_CARE_PROVIDER_SITE_OTHER): Payer: Self-pay | Admitting: Internal Medicine

## 2010-12-05 ENCOUNTER — Encounter: Payer: Self-pay | Admitting: Internal Medicine

## 2010-12-05 VITALS — BP 120/81 | HR 90 | Temp 97.7°F | Ht 61.0 in | Wt 178.7 lb

## 2010-12-05 DIAGNOSIS — J329 Chronic sinusitis, unspecified: Secondary | ICD-10-CM

## 2010-12-05 DIAGNOSIS — J0110 Acute frontal sinusitis, unspecified: Secondary | ICD-10-CM

## 2010-12-05 MED ORDER — LORATADINE 10 MG PO TABS: 10.0000 mg | ORAL_TABLET | Freq: Every day | ORAL | Status: DC

## 2010-12-05 MED ORDER — AMOXICILLIN 500 MG PO CAPS: 500.0000 mg | ORAL_CAPSULE | Freq: Three times a day (TID) | ORAL | Status: AC

## 2010-12-05 MED ORDER — PSEUDOEPHEDRINE HCL 30 MG PO TABS: 30.0000 mg | ORAL_TABLET | ORAL | Status: AC | PRN

## 2010-12-05 MED ORDER — PSEUDOEPHEDRINE HCL 30 MG PO TABS: 30.0000 mg | ORAL_TABLET | ORAL | Status: DC | PRN

## 2010-12-05 MED ORDER — AMOXICILLIN 500 MG PO CAPS: 500.0000 mg | ORAL_CAPSULE | Freq: Three times a day (TID) | ORAL | Status: DC

## 2010-12-05 NOTE — Assessment & Plan Note (Signed)
She has acute sinusitis likely frontal and maxillary considering they exam finding of tenderness and fever along with headaches. We'll prescribe amoxicillin 400 mg 3 times a day for 10 days and also prescribed Claritin 10 mg by mouth daily. She's not able to take Flonase spray do to money issues. Also she's not able to take pseudoephedrine due to the same problem. Found on 1 companies selling pseudoephedrine for 10 cents a pill and is named Mediphedrine. Advised her to search for that and ask the pharmacy if they keep that with them. Explain her to call the clinic if the symptoms doesn't improve of antibiotic use and if anything else comes of breath for the next 2-3 months visit.

## 2010-12-05 NOTE — Progress Notes (Signed)
  Subjective:    Patient ID: Heidi Galvan, female    DOB: 12/25/1965, 45 y.o.   MRN: [id number]  HPI Heidi Galvan is a pleasant 9 year woman with past with a history of recurrent sinusitis, migraine headaches who comes in the clinic with chief complaint of nasal congestion, frontal headache and fever. She has sinusitis for all your round and has trouble breathing at night due to that and recurrent headaches. But for last 3 days she started having worsening frontal headache along with severe nasal and sinus congestion and fever, chills and inability to sleep at night due to the severe congestion. She is not on any allergy medications or Flonase spray as of now.  Denies any chest pain shortness of breath abdominal pain nausea, vomiting, diarrhea   Review of Systems    as per history of present illness Objective:   Physical Exam    Constitutional: Vital signs reviewed.  Patient is a well-developed and well-nourished in no acute distress and cooperative with exam. Alert and oriented x3.  Head: Normocephalic and atraumatic, bifrontal moderate tenderness to palpation, bilateral moderate maxillary tenderness to palpation. Mouth: no erythema or exudates, MMM Eyes: PERRL, EOMI, conjunctivae normal, No scleral icterus.  Neck: Supple, Trachea midline normal ROM, No JVD, mass, thyromegaly, or carotid bruit present.  Cardiovascular: RRR, S1 normal, S2 normal, no MRG, pulses symmetric and intact bilaterally Pulmonary/Chest: CTAB, no wheezes, rales, or rhonchi Abdominal: Soft. Non-tender, non-distended, bowel sounds are normal, no masses, organomegaly, or guarding present.  Musculoskeletal: No joint deformities, erythema, or stiffness, ROM full and no nontender Hematology: no cervical, inginal, or axillary adenopathy.  Neurological: A&O x3, Strenght is normal and symmetric bilaterally, cranial nerve II-XII are grossly intact, no focal motor deficit, sensory intact to light touch bilaterally.    Skin: Warm, dry and intact. No rash, cyanosis, or clubbing.       Assessment & Plan:

## 2010-12-05 NOTE — H&P (Signed)
Heidi Galvan, Heidi Galvan           ACCOUNT NO.:  [id number]   MEDICAL RECORD NO.:  [id number]          PATIENT TYPE:  INP   LOCATION:  5149                         FACILITY:  [facility]   PHYSICIAN:  Thornton Park. Daphine Deutscher, MD  DATE OF BIRTH:  02/05/1960   DATE OF ADMISSION:  05/01/2007  DATE OF DISCHARGE:                              HISTORY & PHYSICAL   CHIEF COMPLAINT:  Right upper quadrant pain and cholecystitis.   HISTORY OF PRESENT ILLNESS:  This is a 45 year old Hispanic female who  was seen in the emergency room this past Sunday night on October 5 with  acute onset of right upper quadrant abdominal pain.  An ultrasound  obtained at that time which was reviewed by me shows multiple small  gallstones and sludge, some gallbladder wall thickening, and at least it  was reported she had a positive sonographic Murphy's sign.  She was  discharged and seen in the [facility] office on October 6 by Dr. Estelle Grumbles  and was scheduled for a laparoscopic cholecystectomy on Monday, October  13.  Since that time she has had unremitting abdominal pain.  It got  worse today, and she was brought to [facility] ED by ambulance.   MEDICATIONS:  Just Tylenol as needed.   ALLERGIES:  PENICILLIN produces a rash.   She is a gravida 3 patient who has had a previous bilateral tubal  ligation.   SOCIAL HISTORY:  She has no drug abuse, nondrinker, nonsmoker.   PHYSICAL EXAMINATION:  VITAL SIGNS:  She is afebrile, blood pressure  151/80, pulse 55, respirations 28.  HEENT:  Head normocephalic.  Eyes:  Sclerae nonicteric.  Pupils equal,  round, and reactive to light.  Nose and throat exam unremarkable.  CHEST:  Clear to auscultation.  HEART:  There is a sinus bradycardia rate about 55.  ABDOMEN:  Mildly obese with some soreness in the right upper quadrant to  palpation, although she has been medicated.  Bowel sounds are present.  EXTREMITIES:  Full range of motion.  No swelling, cyanosis, edema or  clubbing.  NEUROLOGICAL:  Alert and oriented x3.  She was interviewed with  assistance of her husband who could speak much better [place].   LABORATORY:  Hemoglobin 13.4, white count 9,900.  Electrolytes were  normal with exception of glucose 117, creatinine 0.83.  Transaminases  seem to be in the normal range.  Lipase is normal.   IMPRESSION:  Cholecystitis unremitting.   PLAN:  Admit to [facility] MD for Friday, October 10 laparoscopic  cholecystectomy either per Dr. Michaell Cowing who has seen the patient and  obtained informed consent at the office or per Dr. Colin Benton who is [facility] MD  physician of the week.      Thornton Park Daphine Deutscher, MD  Electronically Signed     MBM/MEDQ  D:  05/01/2007  T:  05/02/2007  Job:  [id number]

## 2010-12-05 NOTE — Consult Note (Signed)
Heidi Galvan, Heidi Galvan           ACCOUNT NO.:  [id number]   MEDICAL RECORD NO.:  [id number]          PATIENT TYPE:  INP   LOCATION:  5707                         FACILITY:  [facility]   PHYSICIAN:  Lennie Muckle, MD      DATE OF BIRTH:  02/05/1960   DATE OF CONSULTATION:  05/07/2007  DATE OF DISCHARGE:                                 CONSULTATION   REASON FOR CONSULTATION:  Consulting from the emergency room for right  upper quadrant pain.   HISTORY OF PRESENT ILLNESS:  Heidi Galvan is a 45 year old female who  had laparoscopic cholecystectomy with intraoperative cholangiogram on  May 02, 2007.  She was able to be discharged home on Sunday, May 04, 2007.  She states she had mild abdominal pain upon discharge;  however, over the course of the next few days, she began to have  worsening abdominal pain with radiation to her right shoulder.  She  states she then developed nausea and vomiting today.  She reports having  no fevers or chills at home.  She complains that she has been  constipated since her discharge.  She continues to pass gas and has some  discomfort from bloating.   PAST MEDICAL HISTORY:  She had a tubal ligation.   FAMILY HISTORY:  Noncontributory.   SOCIAL HISTORY:  No alcohol or tobacco use.   DRUG ALLERGIES:  INCLUDE PENICILLIN WHICH CAUSES A RASH.   MEDICATIONS:  Include Vicodin.   REVIEW OF SYSTEMS:  Negative other than the HPI.   PHYSICAL EXAMINATION:  VITAL SIGNS:  Temperature is 97, pulse 89,  respiration rate 21, blood pressure 142/89.  GENERAL:  She is laying on the stretcher, appears uncomfortable.  HEENT EXAM:  Extraocular eye muscles are intact.  No scleral icterus is  evident.  NECK:  Supple.  CHEST:  Clear to auscultation bilaterally.  CARDIOVASCULAR EXAM:  Regular rate and rhythm.  ABDOMEN:  Soft.  She is tender to palpation on the right side of the  abdomen.  No peritoneal signs are noted.  EXTREMITIES:  Without edema.  SKIN EXAM:   Within normal limits.   Labs were drawn in the emergency room.  She does have a white count of  15,000, hemoglobin and hematocrit are 15 and 46.  Her liver enzymes are  elevated, ALT of 252, AST of 94, alk phos of 267, total bili is 3.3.  X-  rays were performed with a possible ileus.   ASSESSMENT/PLAN:  Right upper quadrant pain following laparoscopic  cholecystectomy with elevation in liver enzymes.  Possibilities include  a biloma, and less concerning for an abscess.  I would like to evaluate  Heidi Galvan with a CT scan to see if there is a fluid collection  amendable to drainage.  Pending the CT results, we  either proceed with VIR for drainage, possible ERCP if there is a fluid  collection to evaluate for bile leak versus a HIDA scan for bile leak.  Will give her medications for pain control as well as antiemetics.  Keep  her n.p.o. for now.  SCDs for deep venous thrombosis prophylaxis and  Pepcid for gastrointestinal prophylaxis.      Lennie Muckle, MD  Electronically Signed     ALA/MEDQ  D:  05/08/2007  T:  05/08/2007  Job:  [id number]

## 2010-12-05 NOTE — Patient Instructions (Addendum)
Please make a follow up appt in 2-3 months. Please take amoxicillin 500 mg capsules 3 times a day for next 10 days. Also take Claritin 10 mg tablets daily until at least the allergy season is over. Also asked the pharmacy if they can arrange for mediphedrine - which is one tablet for 12 cents. Also try to use the nasal saline spray which have at home which might help. Call the clinic if he would need to make an early appointment or if the symptoms doesn't get better.

## 2010-12-05 NOTE — Discharge Summary (Signed)
Heidi Galvan, Heidi Galvan           ACCOUNT NO.:  [id number]   MEDICAL RECORD NO.:  [id number]          PATIENT TYPE:  INP   LOCATION:  5149                         FACILITY:  [facility]   PHYSICIAN:  Ardeth Sportsman, MD     DATE OF BIRTH:  02/05/1960   DATE OF ADMISSION:  05/01/2007  DATE OF DISCHARGE:  05/04/2007                               DISCHARGE SUMMARY   SURGEON:  Alfonse Ras, MD   PRIMARY DIAGNOSIS:  Cholecystitis.   OTHER DIAGNOSIS:  Status post bilateral tubal ligation.   PROCEDURE PERFORMED:  Laparoscopic cholecystectomy, then operative  cholangiogram by Dr. Colin Benton on May 02, 2007.   SUMMARY OF HOSPITAL COURSE:  [name] is a 45 year old female who  was seen by myself in the clinic for consideration of a cholecystectomy.  However, in the time in waiting for the surgery to be done, after being  visited she had worsening pain and was admitted by Dr. Daphine Deutscher.  She  underwent laparoscopic cholecystectomy the following morning.  It is now  postoperative day 2 and she is tolerating liquid diet and her pain is  controlled with liquids.  She denies any fever, chills, sweats, nausea  or vomiting.  Her pain is controlled with pills.  She has had passing  flatus.   Based on these improvements, we felt it would be reasonable for her to  be discharged home with the following instructions:  (1) She is to  return to the clinic to see Dr. Colin Benton in about 2 weeks.  (2) She should  take Vicodin 5/500 one to two p.o. q.4h. p.r.n. pain, as well as  Tylenol, ibuprofen, ice packs and heating pads for pain control as  needed.  (3) She should call if she has any fevers, chills, sweats,  worsening nausea, vomiting or uncontrolled pain or other issues.  (4)  She should increase activity as tolerated, avoiding lifting greater than  20 pounds for the next week or so and call if there are any concerns.  The instructions were given to the patient and her family.  They  expressed  appreciation for efforts.      Ardeth Sportsman, MD  Electronically Signed     SCG/MEDQ  D:  05/04/2007  T:  05/04/2007  Job:  [id number]

## 2010-12-05 NOTE — Op Note (Signed)
Heidi Galvan, Heidi Galvan           ACCOUNT NO.:  [id number]   MEDICAL RECORD NO.:  [id number]          PATIENT TYPE:  INP   LOCATION:  5149                         FACILITY:  [facility]   PHYSICIAN:  Alfonse Ras, MD   DATE OF BIRTH:  02/05/1960   DATE OF PROCEDURE:  05/02/2007  DATE OF DISCHARGE:                               OPERATIVE REPORT   PREOPERATIVE DIAGNOSIS:  Acute cholecystitis.   POSTOPERATIVE DIAGNOSIS:  Acute cholecystitis.   PROCEDURE:  Laparoscopic cholecystectomy with intraoperative  cholangiogram.   ANESTHESIA:  General.   SURGEON:  Alfonse Ras, M.D.   ASSISTANT:  Magnus Ivan, RNFA   FINDINGS:  Normal cholangiogram with free flow through the common duct  and into bilateral hepatic ducts and minimal flow into the duodenum  after glucagon administration.   DESCRIPTION OF PROCEDURE:  The patient was taken to the operating room,  placed in the supine position.  After adequate general anesthesia was  induced using endotracheal tube, the abdomen was prepped and draped in  the normal sterile fashion.  Using a transverse infraumbilical incision,  I dissected down to the fascia.  The fascia was opened vertically.  A 0  Vicryl pursestring suture was placed around the fascial defect, and  Hasson trocar was placed in the abdomen.  Pneumoperitoneum was obtained.  The 11-mm trocar was placed in the subxiphoid region, and two 5-mm ports  were placed in the right abdomen.  A slightly edematous gallbladder was  identified, grasped, and retracted cephalad.  A few filmy adhesions were  taken down off the lateral wall of the gallbladder.  The neck was  identified and retracted laterally.  The cystic duct was dissected until  a critical view was obtained and clipped up near the gallbladder.  A  small ductotomy was made.  Reddick catheter was inserted and  cholangiogram was performed.  This showed free flow into the common duct  which was slightly dilated and into the  hepatic ducts.  There was a  little flow into the duodenum, so 1 amp of glucagon was given.  This did  show some flow into the duodenum.  There were no filling defects.  The  cystic duct was then triply clipped and divided after the  cholangiocatheter was removed.  Cystic artery was identified in a  similar fashion, triply clipped, and divided.  The gallbladder was taken  off the gallbladder bed using Bovie electrocautery and placed in an  EndoCatch bag.  It was removed through the umbilical port.  Right upper  quadrant was copiously irrigated.  Adequate hemostasis was  ensured.  The incisions were injected with 0.5 Marcaine.  The skin  incisions were closed with subcuticular 4-0 Monocryl.  Steri-Strips and  sterile dressings were applied.  The patient tolerated the procedure  well, went to PACU in good condition.      Alfonse Ras, MD  Electronically Signed     KRE/MEDQ  D:  05/02/2007  T:  05/03/2007  Job:  [id number]

## 2010-12-07 ENCOUNTER — Ambulatory Visit: Payer: Self-pay | Admitting: Obstetrics & Gynecology

## 2010-12-07 DIAGNOSIS — Z09 Encounter for follow-up examination after completed treatment for conditions other than malignant neoplasm: Secondary | ICD-10-CM

## 2010-12-08 ENCOUNTER — Encounter: Payer: Self-pay | Admitting: *Deleted

## 2010-12-08 NOTE — Progress Notes (Signed)
Pt came into clinic and stated she lost her Rx for amoxicillin.   I called in the Rx to [facility]

## 2010-12-08 NOTE — Group Therapy Note (Signed)
Heidi Galvan, Heidi Galvan           ACCOUNT NO.:  [id number]  MEDICAL RECORD NO.:  [id number]          PATIENT TYPE:  LOCATION:  [facility]                     FACILITY:  PHYSICIAN:  Jaynie Collins, MD     DATE OF BIRTH:  [birth year]  DATE OF SERVICE:  12/07/2010                                 CLINIC NOTE  REASON FOR VISIT:  Postoperative evaluation.  PROCEDURES:  Hydrothermal ablation and D and C on November 13, 2010.  Ms. Heidi Galvan is a 45 year old gravida 3, para 3 with a long history of menorrhagia who underwent a hydrothermal ablation and D and C on November 13, 2010.  After surgery, the patient says that she has been having brown discharge and some lower abdominal cramping.  She does feel like this is consistent with postoperative expectations and so she is not too worried about this.  She is asking for an ibuprofen prescription because she likes a prescription strength I referred for her cramping. Otherwise, the patient says that since surgery, she has not had any bleeding or any other concerns.  Pathology from her dilation and curettage showed secretory endometrium with breakdown.  It shows a endometrial polyp and benign endocervix, no hyperplasia or carcinoma.  PHYSICAL EXAMINATION:  VITAL SIGNS:  Temperature is 99.1, pulse 84, blood pressure 121/85, weight 177.8 pounds, height 59 inches. GENERAL:  No apparent distress. ABDOMEN:  Soft, nontender, nondistended.  No organomegaly. PELVIC:  Normal external female genitalia.  Pink, well-rugated vagina. Small amount of brown discharge seen.  No active cervical discharge. Normal cervix on bimanual exam.  The patient has no uterine cervical or adnexal tenderness.  No abnormal masses palpated.  ASSESSMENT AND PLAN:  The patient is a 44-year gravida 3, para 3, here for postoperative check.  The patient is stable and is doing all that is expected after a hydrothermal ablation.  She was told that she might have some cramping for a few  weeks and she is to take ibuprofen as needed if her pain is not controlled or if she has severe bleeding or any other concerns, she should call or come to the [facility].  The patient is up to date with her Pap smear and mammogram which were both done in December 2011.          ______________________________ Jaynie Collins, MD    UA/MEDQ  D:  12/07/2010  T:  12/08/2010  Job:  [id number]

## 2010-12-08 NOTE — Discharge Summary (Signed)
NAMECHRYSTINE, Heidi Galvan           ACCOUNT NO.:  [id number]   MEDICAL RECORD NO.:  [id number]          PATIENT TYPE:  INP   LOCATION:  5709                         FACILITY:  [facility]   PHYSICIAN:  Revonda Standard L. Rennis Harding, N.P. DATE OF BIRTH:  02/05/1960   DATE OF ADMISSION:  05/07/2007  DATE OF DISCHARGE:  05/10/2007                               DISCHARGE SUMMARY   DISCHARGING PHYSICIAN:  Dr.   Antony Contras COMPLAINT/REASON FOR ADMISSION:  Heidi Galvan is a 45 year old  female patient who underwent laparoscopic cholecystectomy on May 02, 2007, discharged on October 12.  Since discharge, she has had mild  abdominal pain which had increased over a period of days, now radiating  to the right shoulder.  She presented to the emergency department room  where she was evaluated by Dr. Bertram Savin.  She was tender in the right  abdomen and in the upper quadrant.  She had a white count of 15,000.  LFTs were elevated with an ALT of 252, AST 94, total bilirubin 3.3.  X-  rays were consistent with a probable ileus.  The patient was admitted  with a diagnosis of leukocytosis and right upper quadrant pain after  laparoscopic cholecystectomy.  Rule out biloma versus abscess.  Next is mild ileus.   HOSPITAL COURSE:  The patient was admitted.  She is placed on n.p.o.  status, IV fluid hydration.  She underwent a HIDA scan which did confirm  a bile leak.  GI services, Dr. Arlyce Dice, was consulted.  The patient  subsequently underwent ERCP for placement of a stent in the common bile  duct.  The initial attempt at ERCP was on May 08, 2007, but the  patient was unable to be adequately sedated.  She subsequently underwent  successful placement of stent on May 09, 2007.  She was also found  to have some small stones in the common bile duct.  She had been placed  empirically on Cipro and this was continued.   Over the next several days, the patient's diet was advanced.  She had no  further pain.  Her LFTs  began to normalize.  Her white count came down  to 10,500 and plans were to discharge her home on Cipro and follow up  with Dr. Colin Benton.  On the date of discharge, she was tolerating a diet.  Her LFTs had continued to normalize, and she was deemed appropriate for  discharge home.   FINAL DISCHARGE DIAGNOSES:  1. Abdominal pain and leukocytosis secondary to biloma.  2. Elevated LFTs secondary to biloma and small retained stones.  3. Status post ERCP for stone retrieval and biliary stent placement.   DISCHARGE MEDICATIONS:  1. Cipro 500 mg b.i.d. for 5 days.  2. Vicodin as before.  A refill has been written.   DISCHARGE INSTRUCTIONS:  1. Wound care, allowing Steri-Strips to fall off.  2. Diet:  No restrictions.  3. Activity:  No lifting greater than 10 pounds for 1 week, otherwise,      no other activity restrictions.   FOLLOW UP:  The patient needs follow up with either Dr. Colin Benton or Dr.  Newman.  She needs to call to be seen within the next week.      Allison L. Rennis Harding, N.P.     ALE/MEDQ  D:  06/11/2007  T:  06/12/2007  Job:  [id number]   cc:   Alfonse Ras, MD  Sandria Bales. Ezzard Standing, M.D.

## 2011-01-23 ENCOUNTER — Encounter: Payer: Self-pay | Admitting: Internal Medicine

## 2011-01-23 ENCOUNTER — Ambulatory Visit (INDEPENDENT_AMBULATORY_CARE_PROVIDER_SITE_OTHER): Payer: Self-pay | Admitting: Internal Medicine

## 2011-01-23 DIAGNOSIS — F32A Depression, unspecified: Secondary | ICD-10-CM

## 2011-01-23 DIAGNOSIS — F419 Anxiety disorder, unspecified: Secondary | ICD-10-CM

## 2011-01-23 DIAGNOSIS — F341 Dysthymic disorder: Secondary | ICD-10-CM

## 2011-01-23 DIAGNOSIS — D239 Other benign neoplasm of skin, unspecified: Secondary | ICD-10-CM

## 2011-01-23 DIAGNOSIS — D229 Melanocytic nevi, unspecified: Secondary | ICD-10-CM

## 2011-01-23 DIAGNOSIS — F3289 Other specified depressive episodes: Secondary | ICD-10-CM

## 2011-01-23 DIAGNOSIS — F329 Major depressive disorder, single episode, unspecified: Secondary | ICD-10-CM

## 2011-01-23 MED ORDER — BUPROPION HCL 100 MG PO TABS: 100.0000 mg | ORAL_TABLET | Freq: Three times a day (TID) | ORAL | Status: DC

## 2011-01-23 NOTE — Progress Notes (Signed)
  Subjective:    Patient ID: Heidi Galvan, female    DOB: [birth year], 45 y.o.   MRN: [id number]  HPI Patient continues to feel depressed and anxious. Patient denies any SI/HI or mania. Reports increased appetite with weight gain of 6 lbs within 4 months and generalized fatigue.   Review of Systems  Constitutional: Positive for appetite change and fatigue. Negative for fever, chills, diaphoresis and unexpected weight change.  HENT: Negative.   Respiratory: Negative.   Cardiovascular: Negative.   Gastrointestinal: Negative.   Musculoskeletal: Negative.   Neurological: Negative.   Psychiatric/Behavioral: Negative for hallucinations, behavioral problems, confusion, decreased concentration and agitation.        Objective:   Physical Exam  Nursing note and vitals reviewed. Constitutional: She is oriented to person, place, and time. She appears well-developed and well-nourished.  HENT:  Right Ear: External ear normal.  Left Ear: External ear normal.  Nose: Nose normal.  Mouth/Throat: Oropharynx is clear and moist. No oropharyngeal exudate.  Eyes: Conjunctivae and EOM are normal. Pupils are equal, round, and reactive to light. Right eye exhibits no discharge. Left eye exhibits no discharge. No scleral icterus.  Neck: Normal range of motion. Neck supple. No JVD present. No tracheal deviation present. No thyromegaly present.  Cardiovascular: Normal rate, regular rhythm and normal heart sounds.  Exam reveals no friction rub.   No murmur heard. Pulmonary/Chest: Effort normal and breath sounds normal.  Abdominal: Soft. Bowel sounds are normal. She exhibits no distension. There is no tenderness.  Musculoskeletal: Normal range of motion.  Lymphadenopathy:    She has no cervical adenopathy.  Neurological: She is alert and oriented to person, place, and time. She has normal reflexes. She displays normal reflexes. No cranial nerve deficit.  Skin: Skin is warm and dry. No rash noted.    Psychiatric: She has a normal mood and affect. Her behavior is normal. Judgment and thought content normal.   HPI       Assessment & Plan:

## 2011-01-23 NOTE — Patient Instructions (Signed)
Please, follow up with a dermatologist. Please, start taking Buprion for anxiety and call with any questions. Make an office visit in 4-6 weeks.

## 2011-01-27 ENCOUNTER — Encounter: Payer: Self-pay | Admitting: Internal Medicine

## 2011-01-27 DIAGNOSIS — D229 Melanocytic nevi, unspecified: Secondary | ICD-10-CM | POA: Insufficient documentation

## 2011-01-27 MED ORDER — ESCITALOPRAM OXALATE 10 MG PO TABS: 10.0000 mg | ORAL_TABLET | Freq: Every day | ORAL | Status: DC

## 2011-01-27 NOTE — Assessment & Plan Note (Signed)
Patient did not take fluoxetine as was previously prescribed --stopped after taking it for 2 weeks. Denies any side-effects with the medication. Will start Lexapro 10 mg PO daily. Instructed on mechanism of action, side-effects. Strongly advised to adhere with her treatment regimen. Instructed to call with any concerns or if feeling worse. Patient verbalized understanding.

## 2011-02-19 ENCOUNTER — Encounter: Payer: Self-pay | Admitting: Internal Medicine

## 2011-02-19 ENCOUNTER — Telehealth: Payer: Self-pay | Admitting: *Deleted

## 2011-02-19 NOTE — Telephone Encounter (Signed)
Pt here at the clinic.  Pt states she's feeling more tired than usual since she started taking Lexapro. She's not sure if the medication is causing this or something else. She has no energy. Please advise.  Thanks

## 2011-02-21 ENCOUNTER — Ambulatory Visit: Payer: Self-pay | Admitting: Internal Medicine

## 2011-02-23 ENCOUNTER — Encounter: Payer: Self-pay | Admitting: Internal Medicine

## 2011-02-23 ENCOUNTER — Ambulatory Visit (INDEPENDENT_AMBULATORY_CARE_PROVIDER_SITE_OTHER): Payer: Self-pay | Admitting: Internal Medicine

## 2011-02-23 DIAGNOSIS — F329 Major depressive disorder, single episode, unspecified: Secondary | ICD-10-CM

## 2011-02-23 DIAGNOSIS — F341 Dysthymic disorder: Secondary | ICD-10-CM

## 2011-02-23 DIAGNOSIS — N898 Other specified noninflammatory disorders of vagina: Secondary | ICD-10-CM

## 2011-02-23 DIAGNOSIS — F419 Anxiety disorder, unspecified: Secondary | ICD-10-CM

## 2011-02-23 DIAGNOSIS — R109 Unspecified abdominal pain: Secondary | ICD-10-CM

## 2011-02-23 MED ORDER — IBUPROFEN 600 MG PO TABS: 600.0000 mg | ORAL_TABLET | Freq: Four times a day (QID) | ORAL | Status: DC | PRN

## 2011-02-23 MED ORDER — ESCITALOPRAM OXALATE 10 MG PO TABS: 10.0000 mg | ORAL_TABLET | Freq: Every day | ORAL | Status: DC

## 2011-02-23 NOTE — Patient Instructions (Signed)
Please schedule a follow up appointment in 2 months or earlier if needed. Please take your medicines as prescribed. Please schedule a follow up appointment with your gynecologist.

## 2011-02-23 NOTE — Progress Notes (Signed)
  Subjective:    Patient ID: Heidi Galvan, female    DOB: April 29, 1966, 45 y.o.   MRN: [id number]  HPI: 45 year old woman with past medical history significant for depression, iron deficiency anemia comes to the clinic for vaginal discharge for last 2 weeks.   Patient states noticing some yellowish vaginal discharge with strong odor for the last 2 weeks. She also reports some suprapubic discomfort along with it. She states that she had hysterectomy about 3 months ago and had similar kind of discharge immediately postop and  was treated with Flagyl. Also reports severe itching.  Denies any dysuria, urgency or frequency. She is not sexually active and is separated. States her last intercourse was 2 years ago    Review of Systems  Constitutional: Negative for fever, chills, activity change, appetite change and fatigue.  HENT: Negative for nosebleeds, congestion, rhinorrhea, sneezing, postnasal drip and tinnitus.   Respiratory: Negative for apnea, cough, choking, chest tightness, shortness of breath, wheezing and stridor.   Cardiovascular: Negative for chest pain, palpitations and leg swelling.  Gastrointestinal: Negative for abdominal distention.  Genitourinary: Positive for vaginal discharge and vaginal pain.  Musculoskeletal: Negative for arthralgias.  Neurological: Negative for facial asymmetry, light-headedness and headaches.  Hematological: Negative for adenopathy.       Objective:   Physical Exam  Constitutional: She is oriented to person, place, and time. She appears well-developed and well-nourished. No distress.  HENT:  Head: Normocephalic and atraumatic.  Mouth/Throat: No oropharyngeal exudate.  Eyes: Conjunctivae and EOM are normal. Pupils are equal, round, and reactive to light.  Neck: Normal range of motion. Neck supple. No JVD present. No tracheal deviation present. No thyromegaly present.  Cardiovascular: Normal rate, regular rhythm, normal heart sounds and intact distal  pulses.  Exam reveals no gallop and no friction rub.   No murmur heard. Pulmonary/Chest: Effort normal and breath sounds normal. No stridor. No respiratory distress. She has no wheezes. She has no rales. She exhibits no tenderness.  Abdominal: Soft. Bowel sounds are normal. She exhibits no distension and no mass. There is tenderness. There is no rebound and no guarding.       Suprapubic tenderness present.  Genitourinary: Vaginal discharge found.       Blood tinged vaginal discharge noticed, no abnormal mass or swelling appreciated on exam.  Musculoskeletal: Normal range of motion. She exhibits no edema and no tenderness.  Lymphadenopathy:    She has no cervical adenopathy.  Neurological: She is alert and oriented to person, place, and time. She has normal reflexes. She displays normal reflexes. No cranial nerve deficit. She exhibits normal muscle tone. Coordination normal.  Skin: Skin is warm. She is not diaphoretic.          Assessment & Plan:

## 2011-02-24 LAB — WET PREP BY MOLECULAR PROBE: Gardnerella vaginalis: NEGATIVE

## 2011-02-26 NOTE — Assessment & Plan Note (Signed)
Denies any depressed mood at today's visit. States that the new medicine is helping her. We'll continue her on the same regimen.

## 2011-02-26 NOTE — Assessment & Plan Note (Signed)
s/p hysteroscopy, D and C  and hydrothermal ablation for uterine fibroids on 11/13/10. New onset of yellowish vaginal discharge for last 2 weeks, associated with itching and abdominal cramps. Had a similar kind of d/c post op. On exam she was noticed having some blood-tinged discharge. Differentials include post- op complications vs menopause vs bacterial vaginosis.  Patient clearly states that she's not sexually active which makes STD's very unlikely. At this point the etiology is unclear. We'll do a wet prep today. Will refer her to gynecolgist for further evaluation. Will give her ibuprofen for symptomatic treatment.

## 2011-03-02 NOTE — Telephone Encounter (Signed)
Please, have the patient schedule an appointment with the first available provider to reevaluate her therapy. Unfortunately, I am on a night float this month and will not be able to see her personally. To my knowledge, Dr. Abner Greenspan speaks Spanish --perhaps, the patient would be able to see him this coming week.

## 2011-03-07 NOTE — Telephone Encounter (Signed)
Pt was called. Appt has been scheduled for next Thursday 03/15/11 @ 1030AM.

## 2011-03-15 ENCOUNTER — Encounter: Payer: Self-pay | Admitting: Internal Medicine

## 2011-03-15 ENCOUNTER — Ambulatory Visit (INDEPENDENT_AMBULATORY_CARE_PROVIDER_SITE_OTHER): Payer: Self-pay | Admitting: Internal Medicine

## 2011-03-15 DIAGNOSIS — Z862 Personal history of diseases of the blood and blood-forming organs and certain disorders involving the immune mechanism: Secondary | ICD-10-CM

## 2011-03-15 DIAGNOSIS — N898 Other specified noninflammatory disorders of vagina: Secondary | ICD-10-CM

## 2011-03-15 DIAGNOSIS — D509 Iron deficiency anemia, unspecified: Secondary | ICD-10-CM

## 2011-03-15 DIAGNOSIS — R1031 Right lower quadrant pain: Secondary | ICD-10-CM

## 2011-03-15 DIAGNOSIS — R5383 Other fatigue: Secondary | ICD-10-CM

## 2011-03-15 DIAGNOSIS — F329 Major depressive disorder, single episode, unspecified: Secondary | ICD-10-CM

## 2011-03-15 DIAGNOSIS — N920 Excessive and frequent menstruation with regular cycle: Secondary | ICD-10-CM

## 2011-03-15 LAB — CBC
HCT: 38.9 % (ref 36.0–46.0)
Hemoglobin: 12.9 g/dL (ref 12.0–15.0)
MCHC: 33.2 g/dL (ref 30.0–36.0)
MCV: 87.2 fL (ref 78.0–100.0)
RDW: 15.9 % — ABNORMAL HIGH (ref 11.5–15.5)

## 2011-03-15 MED ORDER — NAPROXEN 375 MG PO TABS: 375.0000 mg | ORAL_TABLET | Freq: Two times a day (BID) | ORAL | Status: DC

## 2011-03-15 MED ORDER — HYDROCODONE-ACETAMINOPHEN 5-500 MG PO TABS: 1.0000 | ORAL_TABLET | ORAL | Status: DC | PRN

## 2011-03-15 NOTE — Assessment & Plan Note (Signed)
Patient states that she is continuing to take the Lexapro 10 mg daily. She states it is helping her mood quite a bit. She thinks that it's not helping her fatigue. I think that her fatigue is  partially from her depression, partially from her chronic pain, and could possibly be coming from other etiologies. We will check a CBC and TSH at today's visit. We will see her back in one month.

## 2011-03-15 NOTE — Assessment & Plan Note (Signed)
Patient does have pain that starts in the right lower back and radiates into the right groin. I believe this is coming from a pelvic etiology. 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 She will see gynecology in September and hopefully we'll be able to shed some light on this etiology. If they do not believe this is coming from a gynecologic etiology will have to reevaluate her symptoms and do a more thorough abdominal workup. I did check a CBC and TSH today for her fatigue. I gave her naproxen to be taken twice daily and Vicodin to be taken if that doesn't alleviate the pain as needed. We'll see her back in one month after gynecology sees her.

## 2011-03-15 NOTE — Assessment & Plan Note (Signed)
Patient is having serosanguineous discharge with clots. The yellow discharge is gone. Not associated with itching or cramps. She did not have a hysterectomy in April, she did have a D&C. She will go back to see gynecology in September. I believe that this discharge is probably coming from her uterine fibroids or some other gynecologic pathology. Her wet prep from August 3 was normal. She was given naproxen today for symptomatic relief as well she was given Vicodin. We will see her back in one month after she sees gynecology.

## 2011-03-15 NOTE — Assessment & Plan Note (Signed)
Status post D&C in April. Patient is still having bloody discharge. She will go to see gynecology this September. We will see her back after that visit.

## 2011-03-15 NOTE — Patient Instructions (Addendum)
You were seen today for a follow up visit. We have given you two pain medications. Try the naproxen first. Take it twice a day everyday to take away the pain. If you are still having pain you can take a vicodin. You will follow up with your regular doctor, Dr. Denton Meek in 1 month. Keep taking your medicines everyday. Follow up with the gynecologist in September. I think that your pain is coming from your ovaries and uterus. If you have any new problems or wish to be seen sooner or have questions please feel free to call our office. Our number is [phone]. We have checked a couple of blood tests today. If the results are not normal we will call you with the results.    Te vieron hoy para una visita de seguimiento. Les hemos dado dos medicamentos para Chief Technology Officer. Prueba el naproxeno primero. Tomar dos veces al da todos los das para Hospital doctor. Si usted todava tiene dolor puede tomar un Vicodin. Se har un seguimiento con su mdico de cabecera, el Dr. Denton Meek en 1 mes. Siga tomando sus [employer]. Haga un seguimiento con el gineclogo en septiembre. Creo que el dolor proviene de los ovarios y Careers information officer. Si tiene algn problema nuevo o quieran ser visto ms temprano o tiene alguna pregunta por favor no dude en llamar a nuestra oficina. Nuestro nmero de telfono es [phone]. Hemos probado un par de pruebas de [employer]. Si los resultados no son normales, Photographer con los [place].

## 2011-03-15 NOTE — Assessment & Plan Note (Signed)
Patient is still taking iron daily. We will check a CBC at today's visit.

## 2011-03-15 NOTE — Progress Notes (Signed)
Subjective:    Patient ID: Heidi Galvan, female    DOB: [birth year], 45 y.o.   MRN: [id number]  HPI: The patient is a 45 year old female who comes in today for a followup visit. Patient does speak Spanish, and this conversation happened her interpreter. She is still having vaginal discharge, and describes it as serosanguineous. She states she does have clots in it. She said it is not blood but she describes it as a dark red to black fluid. She did have a D&C in April. There was not found any malignant cells in the current time she. She will go back to see GYN in September. She is having pain today that starts in the pelvic area and had a radiates around the sides of lower back. She said it's worse with bending over she is currently unable to bend over without excruciating pain. This pain has been keeping her from sleeping the past several days. She denies any fevers or chills at home she states that she has been taking Lexapro since the end of July and her mood is much better on it. She still thinks that she is a little bit tired all the time and has lack of energy. He states the pain keeps her from doing a lot of things that she tries to exercise daily. She states that she has been taking all her medications every day. She is having a few issues with sinus congestion, however we will put that off for this visit and address her pain today.    Review of Systems  Constitutional: Positive for activity change. Negative for fever, chills, diaphoresis, appetite change, fatigue and unexpected weight change.       Patient is not able to do her activities of daily living since she is having all this pain. She is unable to bend down.  HENT: Positive for congestion and sinus pressure. Negative for hearing loss, ear pain, nosebleeds, sore throat, facial swelling, rhinorrhea, sneezing, mouth sores, trouble swallowing, neck pain, neck stiffness, voice change, postnasal drip, tinnitus and ear discharge.     Cardiovascular: Negative for chest pain, palpitations and leg swelling.  Gastrointestinal: Positive for abdominal pain. Negative for nausea, vomiting, diarrhea, constipation, blood in stool, abdominal distention, anal bleeding and rectal pain.       Patient does have pain that starts in the lower back and radiates into the right groin.  Genitourinary: Positive for flank pain, vaginal bleeding, vaginal discharge, vaginal pain and pelvic pain. Negative for dysuria, urgency, frequency, hematuria, decreased urine volume, enuresis, difficulty urinating, genital sores and dyspareunia.       Patient is a pain that starts in the lower back and radiates into the groin, she is also having discharge that is serosanguineous with clots.  Musculoskeletal: Positive for myalgias and back pain. Negative for joint swelling, arthralgias and gait problem.  Skin: Negative.   Neurological: Negative.   Hematological: Negative.   Psychiatric/Behavioral: Negative.    Vitals: BP: 133/90 Temperature: 45F Pulse: 76 Height 5 foot 1 inch Weight 174 pounds    Objective:   Physical Exam  Constitutional: She is oriented to person, place, and time. She appears well-developed and well-nourished.  HENT:  Head: Normocephalic and atraumatic.  Eyes: EOM are normal. Pupils are equal, round, and reactive to light.  Neck: Normal range of motion. Neck supple. No tracheal deviation present. No thyromegaly present.  Cardiovascular: Normal rate, regular rhythm and normal heart sounds.   No murmur heard. Pulmonary/Chest: Effort normal and breath sounds normal. No  respiratory distress. She has no wheezes. She has no rales. She exhibits no tenderness.  Abdominal: Soft. Bowel sounds are normal. She exhibits no distension and no mass. There is no tenderness. There is no rebound and no guarding.  Genitourinary: Vaginal discharge found.       Patient does state that she is having serosanguineous vaginal discharge with clots. I did not  do a vaginal exam today, one was performed on August 3. She was having the same symptoms then.  Musculoskeletal: Normal range of motion. She exhibits tenderness. She exhibits no edema.  Lymphadenopathy:    She has no cervical adenopathy.  Neurological: She is alert and oriented to person, place, and time. No cranial nerve deficit.  Skin: Skin is warm and dry. No rash noted. No erythema. No pallor.  Psychiatric: She has a normal mood and affect. Her behavior is normal. Judgment and thought content normal.          Assessment & Plan:   1. Followup-patient was seen in followup for some vaginal discharge. She was seen at the beginning of August and did have a wet prep done that was negative for infection. She will go to see GYN and I do believe that this is where a lot of her pain is coming from. If GYN feel that this pain is not related to GYN issues we will have to reevaluate this pain at future visit. I will prescribe naproxen 375 mg be taken twice daily and Vicodin 5 500 mg 20 pills to be taken as needed for pain. This pain is keeping her from sleeping so I do want to relieve it so that she is able to get some rest. We will also check a CBC and TSH at today's visit to see if her fatigue is coming from either of those sources. I do believe that some of the fatigue is coming from her depression is not fully treated at this time. Some of her fatigue may also be coming from this chronic pain.  2. Please see problem-oriented charting.  3. Disposition-patient will be seen back in one month after she sees GYN. We will await their recommendations regarding this pain. I do believe that it may be necessary for her to have a hysterectomy. 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 She continues to have discharge even after D&C.  4. Other medical problems not addressed at today's visit: Migraine, sinusitis.

## 2011-03-19 NOTE — Progress Notes (Signed)
I agree with assessment and plan as per Dr. Kollar. 

## 2011-04-09 ENCOUNTER — Encounter: Payer: Self-pay | Admitting: Obstetrics & Gynecology

## 2011-04-09 ENCOUNTER — Ambulatory Visit (INDEPENDENT_AMBULATORY_CARE_PROVIDER_SITE_OTHER): Payer: Self-pay | Admitting: Obstetrics & Gynecology

## 2011-04-09 VITALS — BP 130/87 | HR 76 | Temp 97.4°F | Wt 173.6 lb

## 2011-04-09 DIAGNOSIS — N949 Unspecified condition associated with female genital organs and menstrual cycle: Secondary | ICD-10-CM

## 2011-04-09 DIAGNOSIS — N898 Other specified noninflammatory disorders of vagina: Secondary | ICD-10-CM

## 2011-04-09 DIAGNOSIS — R102 Pelvic and perineal pain: Secondary | ICD-10-CM

## 2011-04-09 NOTE — Progress Notes (Signed)
  Subjective:     Heidi Galvan is a 45 y.o. female who presents for evaluation of abdominal pain. The pain is described as cramping, and is 5/10 in intensity. Pain is located in the suprapubic and deep pelvis area without radiation. Onset was intermittent occurring 1 week ago. Symptoms have been unchanged since. Aggravating factors: none. Alleviating factors: none. Associated symptoms: copious amount of yellow, malodorous vaginal discharge. The patient denies chills, fever, nausea, sweats and denies any other systemic symptoms. Risk factors for pelvic/abdominal pain include prior pelvic surgery.  She underwent an endometrial ablation in 10/2010.  Menstrual History: OB History    Grav Para Term Preterm Abortions TAB SAB Ect Mult Living   3 3 3  0 0 0 0 0 0 3      Patient's last menstrual period was 03/26/2011.    The following portions of the patient's history were reviewed and updated as appropriate: allergies, current medications, past family history, past medical history, past social history, past surgical history and problem list. Pap smear in 06/2010 was normal, up to date with mammogram.   Review of Systems A comprehensive review of systems was negative.    Objective:    BP 130/87  Pulse 76  Temp(Src) 97.4 F (36.3 C) (Oral)  Wt 173 lb 9.6 oz (78.744 kg)  LMP 03/26/2011 General:   alert and no distress  Lungs:   clear to auscultation bilaterally  Heart:   regular rate and rhythm  Abdomen:  soft, non-tender; bowel sounds normal; no masses,  no organomegaly  CVA:   absent  Pelvis:  Vulva and vagina appear normal. Bimanual exam reveals normal uterus and adnexa. Mild uterine tenderness to palpation, no abnormal masses  Extremities:   extremities normal, atraumatic, no cyanosis or edema  Neurologic:   negative  Psychiatric:   normal mood, behavior, speech, dress, and thought processes   Lab Review Labs: GC/Chlamydia DNA probe of cervico/vaginal secretions and Wet mount of  vaginal secretions   Imaging Ultrasound - Pelvic Vaginal    Assessment:    Infectious: Will follow up labs Functional: Follow up ultrasound Vulvar hygiene emphasized    Plan:    See orders for lab and imaging studies. Reassured patient that symptoms are almost certainly benign and self-resolving. Follow up as needed.

## 2011-04-10 LAB — WET PREP, GENITAL
Trich, Wet Prep: NONE SEEN
WBC, Wet Prep HPF POC: NONE SEEN
Yeast Wet Prep HPF POC: NONE SEEN

## 2011-04-10 LAB — GC/CHLAMYDIA PROBE AMP, GENITAL
Chlamydia, DNA Probe: NEGATIVE
GC Probe Amp, Genital: NEGATIVE

## 2011-04-12 ENCOUNTER — Ambulatory Visit (HOSPITAL_COMMUNITY)
Admission: RE | Admit: 2011-04-12 | Discharge: 2011-04-12 | Disposition: A | Discharge status: Home / Self Care | Payer: Self-pay | Source: Ambulatory Visit | Attending: Obstetrics & Gynecology | Admitting: Obstetrics & Gynecology

## 2011-04-12 DIAGNOSIS — N949 Unspecified condition associated with female genital organs and menstrual cycle: Secondary | ICD-10-CM | POA: Insufficient documentation

## 2011-04-12 DIAGNOSIS — N898 Other specified noninflammatory disorders of vagina: Secondary | ICD-10-CM

## 2011-04-16 ENCOUNTER — Ambulatory Visit (INDEPENDENT_AMBULATORY_CARE_PROVIDER_SITE_OTHER): Payer: Self-pay | Admitting: Internal Medicine

## 2011-04-16 ENCOUNTER — Encounter: Payer: Self-pay | Admitting: Internal Medicine

## 2011-04-16 VITALS — BP 147/94 | HR 88 | Temp 97.1°F | Ht 61.0 in | Wt 176.3 lb

## 2011-04-16 DIAGNOSIS — B353 Tinea pedis: Secondary | ICD-10-CM | POA: Insufficient documentation

## 2011-04-16 DIAGNOSIS — N898 Other specified noninflammatory disorders of vagina: Secondary | ICD-10-CM

## 2011-04-16 DIAGNOSIS — R109 Unspecified abdominal pain: Secondary | ICD-10-CM

## 2011-04-16 DIAGNOSIS — R10A1 Flank pain, right side: Secondary | ICD-10-CM | POA: Insufficient documentation

## 2011-04-16 DIAGNOSIS — R300 Dysuria: Secondary | ICD-10-CM

## 2011-04-16 LAB — URINALYSIS, ROUTINE W REFLEX MICROSCOPIC
Bilirubin Urine: NEGATIVE
Glucose, UA: NEGATIVE mg/dL
Hgb urine dipstick: NEGATIVE
Ketones, ur: NEGATIVE mg/dL
Leukocytes, UA: NEGATIVE
Nitrite: NEGATIVE
Protein, ur: NEGATIVE mg/dL
Specific Gravity, Urine: 1.011 (ref 1.005–1.030)
Urobilinogen, UA: 0.2 mg/dL (ref 0.0–1.0)
pH: 5.5 (ref 5.0–8.0)

## 2011-04-16 MED ORDER — KETOCONAZOLE 2 % EX CREA: TOPICAL_CREAM | Freq: Every day | CUTANEOUS | Status: DC

## 2011-04-16 NOTE — Assessment & Plan Note (Signed)
Yellow, odorous vaginal discharge present for 1-2 months, now with dysuria, with wet prep, gonorrhea, chlamydia, and [place] negative, with prior eval by OB/GYN. -checking UA today for UTI -will follow-up

## 2011-04-16 NOTE — Patient Instructions (Addendum)
Your back pain may be due to kidney stones.  We would like to obtain a CT scan to evaluate this and other possible causes of this pain. -we are also running some lab tests on your urine to check for infection  For your athlete's foot, apply Ketoconazole cream to the area twice per day for 4 weeks  Please return for a follow-up visit in 1 month

## 2011-04-16 NOTE — Assessment & Plan Note (Signed)
Patient notes a chronic R flank pain, radiating to the groin, worsening over the last year, with negative pelvic US and CT abd/pelvis in Feb 2012.  This case was discussed extensively with Dr. Meredith Pel.  The patient's pain may be due renal calculi (previously seen on contralateral kidney) vs lumbar spine pathology vs hip pathology (given abnormalities seen on physical exam) -recommend CT abd/pelvis -cmp ordered -UA ordered

## 2011-04-16 NOTE — Progress Notes (Signed)
HPI The patient is a 45 yo woman, presenting for a follow-up visit.  The patient notes a history of right flank pain for several years, worsening over the last 1 year, present as a constant, throbbing right flank pain, occasionally radiating to the right groin.  The pain is worsened by bending over, and occasionally radiates to her right leg.  The pain is associated with some subjective numbness of the right anterior thigh.  The pain appears to worsen with episodes of constipation, and is significantly relieved after defecation.  The patient notes alternating episodes of constipation and diarrhea for several years, with no attempted treatment.  The patient underwent abd/pelvis CT Feb 2012, which found small calculi in L kidney.  She also underwent evaluation by OB/GYN 1 week ago with pelvic US, which was negative.  The patient notes no hematuria, fevers.  The patient also notes a 1-2 month history of yellow, malodorous vaginal discharge every other day.  She notes that she is not sexually active.  A wet prep was performed by our clinic about 6 weeks ago, which was negative.  Another wet prep, with gonorrhea/chlamydia swab, was performed by the patient's OB/GYN 1 week ago, which was also negative, along with a negative transvaginal/pelvic US.  The patient's symptoms have not changed during this time period.  The patient also notes a 2-3 day history of burning with urination, without urinary frequency or urgency.  The patient also notes a 3-week history of bilateral feet redness and itching, particularly between her toes.  She has tried an over-the-counter cream for this problem (she is unsure of the name), with no relief of symptoms.  ROS: General: no fevers, chills, changes in weight, changes in appetite HEENT: no blurry vision, hearing changes, sore throat Pulm: no dyspnea, coughing, wheezing CV: no chest pain, palpitations, shortness of breath Abd: see HPI, no nausea/vomiting,  diarrhea/constipation GU: see HPI Ext: no arthralgias, myalgias Neuro: no weakness, numbness, or tingling  Filed Vitals:   04/16/11 1054  BP: 147/94  Pulse: 88  Temp: 97.1 F (36.2 C)    PEX General: alert, cooperative, and in no apparent distress HEENT: pupils equal round and reactive to light, vision grossly intact, oropharynx clear and non-erythematous  Neck: supple, no lymphadenopathy, JVD Lungs: clear to ascultation bilaterally, normal work of respiration, no wheezes, rales, ronchi Heart: regular rate and rhythm, no murmurs, gallops, or rubs Abdomen: soft, mild-moderately tender to deep suprapubic palpation, non-distended, +bs, no rebound tenderness, no guarding   Back: mildly tender to deep palpation of right flank, no frank CVA tenderness Extremities: pain elicited upon right straight leg raise, and upon full flexion of right hip, no pain elicited upon left straight leg raise or hip flexion Neurologic: alert & oriented X3, cranial nerves II-XII intact, strength grossly intact, sensation intact to light touch  Assessment/Plan:

## 2011-04-16 NOTE — Assessment & Plan Note (Signed)
Erythematous rash on foot appears to be tinea pedis -patient prescribed ketoconazole cream daily x4 weeks

## 2011-04-17 LAB — COMPLETE METABOLIC PANEL WITH GFR
AST: 19 U/L (ref 0–37)
Albumin: 4.1 g/dL (ref 3.5–5.2)
BUN: 12 mg/dL (ref 6–23)
Calcium: 8.5 mg/dL (ref 8.4–10.5)
Chloride: 104 mEq/L (ref 96–112)
Glucose, Bld: 112 mg/dL — ABNORMAL HIGH (ref 70–99)
Potassium: 3.9 mEq/L (ref 3.5–5.3)
Sodium: 140 mEq/L (ref 135–145)
Total Protein: 7.2 g/dL (ref 6.0–8.3)

## 2011-04-17 NOTE — Progress Notes (Signed)
I saw patient and discussed her care with resident Dr. Manson Passey.  I agree with the plans as outlined in his note.

## 2011-04-18 ENCOUNTER — Ambulatory Visit (HOSPITAL_COMMUNITY)
Admission: RE | Admit: 2011-04-18 | Discharge: 2011-04-18 | Disposition: A | Discharge status: Home / Self Care | Payer: Self-pay | Source: Ambulatory Visit | Attending: Internal Medicine | Admitting: Internal Medicine

## 2011-04-18 ENCOUNTER — Other Ambulatory Visit: Payer: Self-pay | Admitting: Internal Medicine

## 2011-04-18 DIAGNOSIS — R634 Abnormal weight loss: Secondary | ICD-10-CM | POA: Insufficient documentation

## 2011-04-18 DIAGNOSIS — R197 Diarrhea, unspecified: Secondary | ICD-10-CM | POA: Insufficient documentation

## 2011-04-18 DIAGNOSIS — M549 Dorsalgia, unspecified: Secondary | ICD-10-CM | POA: Insufficient documentation

## 2011-04-18 DIAGNOSIS — N200 Calculus of kidney: Secondary | ICD-10-CM | POA: Insufficient documentation

## 2011-04-18 DIAGNOSIS — R109 Unspecified abdominal pain: Secondary | ICD-10-CM

## 2011-04-18 DIAGNOSIS — R112 Nausea with vomiting, unspecified: Secondary | ICD-10-CM | POA: Insufficient documentation

## 2011-04-18 DIAGNOSIS — R509 Fever, unspecified: Secondary | ICD-10-CM | POA: Insufficient documentation

## 2011-04-18 DIAGNOSIS — R1031 Right lower quadrant pain: Secondary | ICD-10-CM | POA: Insufficient documentation

## 2011-04-18 LAB — URINE CULTURE: Colony Count: NO GROWTH

## 2011-04-18 MED ORDER — IOHEXOL 300 MG/ML  SOLN: 74.0000 mL | Freq: Once | INTRAMUSCULAR | Status: AC | PRN

## 2011-05-02 ENCOUNTER — Telehealth: Payer: Self-pay | Admitting: *Deleted

## 2011-05-02 LAB — CBC
HCT: 33.2 — ABNORMAL LOW
Hemoglobin: 12.4
MCHC: 34.1
MCV: 83.5
MCV: 84.3
MCV: 85.1
Platelets: 343
Platelets: 419 — ABNORMAL HIGH
RBC: 4.36
RDW: 15.1 — ABNORMAL HIGH
RDW: 15.3 — ABNORMAL HIGH
RDW: 15.6 — ABNORMAL HIGH
WBC: 10.5
WBC: 15 — ABNORMAL HIGH

## 2011-05-02 LAB — COMPREHENSIVE METABOLIC PANEL
AST: 37
Albumin: 2.1 — ABNORMAL LOW
Albumin: 2.3 — ABNORMAL LOW
Albumin: 3.3 — ABNORMAL LOW
Alkaline Phosphatase: 256 — ABNORMAL HIGH
Alkaline Phosphatase: 267 — ABNORMAL HIGH
BUN: 11
BUN: 3 — ABNORMAL LOW
BUN: 6
CO2: 22
CO2: 24
Chloride: 105
Chloride: 95 — ABNORMAL LOW
Creatinine, Ser: 0.82
Creatinine, Ser: 0.87
GFR calc Af Amer: >60
GFR calc non Af Amer: >60
Glucose, Bld: 106 — ABNORMAL HIGH
Potassium: 3.4 — ABNORMAL LOW
Potassium: 3.6
Total Bilirubin: 1.3 — ABNORMAL HIGH
Total Bilirubin: 3.3 — ABNORMAL HIGH
Total Protein: 5.7 — ABNORMAL LOW

## 2011-05-02 LAB — DIFFERENTIAL
Basophils Absolute: 0
Basophils Relative: 0
Eosinophils Absolute: 0
Eosinophils Relative: 0
Eosinophils Relative: 1
Lymphocytes Relative: 16
Lymphs Abs: 1.5
Lymphs Abs: 1.6
Monocytes Absolute: 0.6
Monocytes Relative: 6
Neutro Abs: 8.1 — ABNORMAL HIGH
Neutrophils Relative %: 86 — ABNORMAL HIGH

## 2011-05-02 LAB — POCT I-STAT CREATININE
Creatinine, Ser: 0.8
Operator id: 270111

## 2011-05-02 LAB — I-STAT 8, (EC8 V) (CONVERTED LAB)
Acid-Base Excess: 5 — ABNORMAL HIGH
Bicarbonate: 25.2 — ABNORMAL HIGH
Glucose, Bld: 182 — ABNORMAL HIGH
Potassium: 3.3 — ABNORMAL LOW
TCO2: 26
pCO2, Ven: 25.8 — ABNORMAL LOW
pH, Ven: 7.598 — ABNORMAL HIGH

## 2011-05-02 LAB — ALKALINE PHOSPHATASE: Alkaline Phosphatase: 233 — ABNORMAL HIGH

## 2011-05-02 LAB — ALT: ALT: 196 — ABNORMAL HIGH

## 2011-05-02 LAB — BILIRUBIN, TOTAL: Total Bilirubin: 2.2 — ABNORMAL HIGH

## 2011-05-02 NOTE — Telephone Encounter (Signed)
Pt calls and states she has a cough, h/a and fever for several days, she does sound congested. She does not speak english very well. Does not know what her temp has been or exactly how many days she has been sick. She is ask to go to urg care to be seen and given directions as to where urg care is at. She is also reminded of her appt 10/15 w/ pcp

## 2011-05-02 NOTE — Telephone Encounter (Signed)
Agree with [facility]. Thanks .

## 2011-05-03 LAB — COMPREHENSIVE METABOLIC PANEL
ALT: 15
BUN: 12
BUN: 19
CO2: 24
CO2: 26
Calcium: 8.3 — ABNORMAL LOW
Calcium: 9.1
Chloride: 104
Creatinine, Ser: 0.81
GFR calc Af Amer: >60
GFR calc non Af Amer: >60
GFR calc non Af Amer: >60
Glucose, Bld: 117 — ABNORMAL HIGH
Sodium: 137
Total Bilirubin: 0.7

## 2011-05-03 LAB — CBC
HCT: 38.6
HCT: 39.8
Hemoglobin: 13.1
Hemoglobin: 13.4
MCHC: 33.8
MCHC: 33.6
MCV: 83.9
Platelets: 264
Platelets: 323
RBC: 4.48
RBC: 4.59
RBC: 4.74
RDW: 15.1 — ABNORMAL HIGH
RDW: 15.7 — ABNORMAL HIGH
WBC: 6.5
WBC: 9.9

## 2011-05-03 LAB — DIFFERENTIAL
Basophils Absolute: 0
Basophils Absolute: 0.2 — ABNORMAL HIGH
Basophils Relative: 2 — ABNORMAL HIGH
Eosinophils Absolute: 0.1
Eosinophils Relative: 1
Lymphocytes Relative: 19
Lymphocytes Relative: 22
Lymphs Abs: 1.5
Lymphs Abs: 2.2
Monocytes Absolute: 0.4
Monocytes Relative: 4
Neutro Abs: 6.1
Neutro Abs: 7
Neutrophils Relative %: 74
Neutrophils Relative %: 71

## 2011-05-03 LAB — COMPREHENSIVE METABOLIC PANEL WITH GFR
AST: 17
Albumin: 3.8
Alkaline Phosphatase: 79
Chloride: 102
Creatinine, Ser: 0.83
GFR calc Af Amer: >60
Potassium: 3.8
Total Bilirubin: 0.5
Total Protein: 7.5

## 2011-05-03 LAB — URINALYSIS, ROUTINE W REFLEX MICROSCOPIC
Bilirubin Urine: NEGATIVE
Nitrite: NEGATIVE
Specific Gravity, Urine: 1.015
pH: 7.5

## 2011-05-03 LAB — URINE MICROSCOPIC-ADD ON

## 2011-05-03 LAB — PREGNANCY, URINE: Preg Test, Ur: NEGATIVE

## 2011-05-03 LAB — LIPASE, BLOOD
Lipase: 24
Lipase: 34

## 2011-05-07 ENCOUNTER — Encounter: Payer: Self-pay | Admitting: Internal Medicine

## 2011-05-07 ENCOUNTER — Ambulatory Visit (INDEPENDENT_AMBULATORY_CARE_PROVIDER_SITE_OTHER): Payer: Self-pay | Admitting: Internal Medicine

## 2011-05-07 VITALS — BP 135/87 | HR 80 | Temp 97.2°F | Ht 61.0 in | Wt 180.6 lb

## 2011-05-07 DIAGNOSIS — D239 Other benign neoplasm of skin, unspecified: Secondary | ICD-10-CM

## 2011-05-07 DIAGNOSIS — D229 Melanocytic nevi, unspecified: Secondary | ICD-10-CM

## 2011-05-07 DIAGNOSIS — J329 Chronic sinusitis, unspecified: Secondary | ICD-10-CM

## 2011-05-07 MED ORDER — AZITHROMYCIN 250 MG PO TABS: ORAL_TABLET | ORAL | Status: AC

## 2011-05-07 NOTE — Progress Notes (Signed)
  Subjective:    Patient ID: Heidi Galvan, female    DOB: Nov 09, 1965, 45 y.o.   MRN: [id number]  HPI 1. C/ nasal congestion, facial pain, fever, chills ad feeling overall tired for 1 week. Patient states that her son has had a cold last week. Denies any rash, HA/neck pain, SOB, abdominal pain or leg pain/swelling; no dysuria or hematuria.   Review of Systems Per HPI    Objective:   Physical Exam Vitals: reviewed. General: alert, well-developed, and cooperative to examination.  Head: normocephalic and atraumatic.  Eyes: vision grossly intact, pupils equal, pupils round, pupils reactive to light, no injection and anicteric.  Mouth: pharynx pink and moist, no erythema, and no exudates. Bilateral Maxillary sinus TTP noted; nasal turbinates are erythematous and with thick, green nasal discharge. Neck: supple, full ROM, no thyromegaly, no JVD, and no carotid bruits.  Lungs: normal respiratory effort, no accessory muscle use, normal breath sounds, no crackles, and no wheezes. Heart: normal rate, regular rhythm, no murmur, no gallop, and no rub.  Abdomen: soft, non-tender, normal bowel sounds, no distention, no guarding, no rebound tenderness, no hepatomegaly, and no splenomegaly.  Msk: no joint swelling, no joint warmth, and no redness over joints.  Pulses: 2+ DP/PT pulses bilaterally Extremities: No cyanosis, clubbing, edema Neurologic: alert & oriented X3, cranial nerves II-XII intact, strength normal in all extremities, sensation intact to light touch, and gait normal.  Skin: turgor normal and no rashes.  Psych: Oriented X3, memory intact for recent and remote, normally interactive, good eye contact, not anxious appearing, and not depressed appearing.          Assessment & Plan:  1. Sinusitis -Z-pack -Saline nasal irrigations -hydration, rest -f/u if not feeling beter.

## 2011-05-07 NOTE — Patient Instructions (Signed)
Please, follow up with a dermatologist. Please, pick up a prescription for antibiotics. Please, drink plenty of water and rest.

## 2011-05-18 ENCOUNTER — Inpatient Hospital Stay (INDEPENDENT_AMBULATORY_CARE_PROVIDER_SITE_OTHER)
Admission: RE | Admit: 2011-05-18 | Discharge: 2011-05-18 | Disposition: A | Discharge status: Home / Self Care | Payer: Self-pay | Source: Ambulatory Visit | Attending: Family Medicine | Admitting: Family Medicine

## 2011-05-18 DIAGNOSIS — J019 Acute sinusitis, unspecified: Secondary | ICD-10-CM

## 2011-05-18 DIAGNOSIS — J310 Chronic rhinitis: Secondary | ICD-10-CM

## 2011-05-24 ENCOUNTER — Ambulatory Visit (INDEPENDENT_AMBULATORY_CARE_PROVIDER_SITE_OTHER): Payer: Self-pay | Admitting: Internal Medicine

## 2011-05-24 ENCOUNTER — Encounter: Payer: Self-pay | Admitting: Internal Medicine

## 2011-05-24 DIAGNOSIS — J322 Chronic ethmoidal sinusitis: Secondary | ICD-10-CM

## 2011-05-24 DIAGNOSIS — F329 Major depressive disorder, single episode, unspecified: Secondary | ICD-10-CM

## 2011-05-24 DIAGNOSIS — F341 Dysthymic disorder: Secondary | ICD-10-CM

## 2011-05-24 DIAGNOSIS — M25519 Pain in unspecified shoulder: Secondary | ICD-10-CM

## 2011-05-24 MED ORDER — SULINDAC 200 MG PO TABS: 200.0000 mg | ORAL_TABLET | Freq: Two times a day (BID) | ORAL | Status: DC

## 2011-05-24 MED ORDER — ESCITALOPRAM OXALATE 10 MG PO TABS: 10.0000 mg | ORAL_TABLET | Freq: Every day | ORAL | Status: DC

## 2011-05-24 NOTE — Progress Notes (Signed)
Subjective:   Patient ID: Heidi Galvan female   DOB: [birth year] 45 y.o.   MRN: [id number]  HPI: Ms.Analeya Galvan is a 45 y.o. pleasant woman who return to clinic with bifrontal HA, nasal congestion, loss of odor sensation. Completed Z-pack without any improvement; patient continues to use Flonase and Claritin. Denis exposure to smoke. Denies fever,chills, neck pain, CP, SOB, rash, abdominal pain or flank pain. Denies hearing deficit or dizziness.    Past Medical History  Diagnosis Date  . Menometrorrhagia   . Migraine   . Depression   . Iron deficiency anemia    Current Outpatient Prescriptions  Medication Sig Dispense Refill  . escitalopram (LEXAPRO) 10 MG tablet Take 1 tablet (10 mg total) by mouth daily.  30 tablet  3  . ferrous fumarate (HEMOCYTE) 325 (106 FE) MG TABS Take 1 tablet by mouth 3 (three) times daily. With orange juice for anemia       . fluticasone (FLONASE) 50 MCG/ACT nasal spray 1 spray by Nasal route daily.  16 g  2  . ketoconazole (NIZORAL) 2 % cream Apply topically daily.  15 g  0  . loratadine (CLARITIN) 10 MG tablet Take 1 tablet (10 mg total) by mouth daily.  30 tablet  11  . naproxen (NAPROSYN) 375 MG tablet Take 1 tablet (375 mg total) by mouth 2 (two) times daily with a meal.  60 tablet  2   Family History  Problem Relation Age of Onset  . Asthma Mother   . Hypertension Mother   . Asthma Daughter   . Asthma Son    History   Social History  . Marital Status: Married    Spouse Name: N/A    Number of Children: N/A  . Years of Education: N/A   Social History Main Topics  . Smoking status: Never Smoker   . Smokeless tobacco: Not on file  . Alcohol Use: No  . Drug Use: No  . Sexually Active: No   Other Topics Concern  . Not on file   Social History Narrative   Financial assistance approved for 100% discount at [facility] and has [facility] card per Gavin [id number]   Review of Systems: No changes since last review.  Objective:  Physical  Exam: There were no vitals filed for this visit. Constitutional: Vital signs reviewed.  Patient is a well-developed and well-nourished pleasant woman in no acute distress and cooperative with exam. Alert and oriented x3.  Head: Normocephalic and atraumatic Ear: TM normal bilaterally Mouth: no erythema or exudates, MMM Eyes: PERRL, EOMI, conjunctivae normal, No scleral icterus.  Neck: Supple, Trachea midline normal ROM, No JVD, mass, thyromegaly, or carotid bruit present.  Cardiovascular: RRR, S1 normal, S2 normal, no MRG, pulses symmetric and intact bilaterally Pulmonary/Chest: CTAB, no wheezes, rales, or rhonchi Abdominal: Soft. Non-tender, non-distended, bowel sounds are normal, no masses, organomegaly, or guarding present.  GU: no CVA tenderness Musculoskeletal: No joint deformities, erythema, or stiffness, ROM full and no nontender Hematology: no cervical, inginal, or axillary adenopathy.  Neurological: A&O x3, Strenght is normal and symmetric bilaterally, cranial nerve II-XII are grossly intact, no focal motor deficit, sensory intact to light touch bilaterally.  Skin: Warm, dry and intact. No rash, cyanosis, or clubbing.  Psychiatric: Normal mood and affect. speech and behavior is normal. Judgment and thought content normal. Cognition and memory are normal.   Assessment & Plan:    1. Chronic frontal/ethmoid sinusitis -no response to macrolides -patient is allergic to PCN (rash) - referral to  ENT per the patient's request. - Declined Flu shot.  2. Chronic pain, suspect a psychosomatic component -restart Lexapro ( patient stopped "because forgot to refill it.") -D/C Naprosyn -Start Sulindac F/u PRN

## 2011-05-24 NOTE — Progress Notes (Deleted)
  Subjective:    Patient ID: Heidi Galvan, female    DOB: [birth year], 45 y.o.   MRN: [id number]  HPI    Review of Systems     Objective:   Physical Exam        Assessment & Plan:

## 2011-05-24 NOTE — Patient Instructions (Signed)
Please, follow up with a ears-nose-throat specialist. Please, fill in your prescriptions for depression and pain. Please, call with any questions.

## 2011-06-01 ENCOUNTER — Telehealth: Payer: Self-pay | Admitting: *Deleted

## 2011-06-01 ENCOUNTER — Encounter: Payer: Self-pay | Admitting: Internal Medicine

## 2011-06-01 ENCOUNTER — Ambulatory Visit (INDEPENDENT_AMBULATORY_CARE_PROVIDER_SITE_OTHER): Payer: Self-pay | Admitting: Internal Medicine

## 2011-06-01 DIAGNOSIS — F329 Major depressive disorder, single episode, unspecified: Secondary | ICD-10-CM

## 2011-06-01 DIAGNOSIS — J329 Chronic sinusitis, unspecified: Secondary | ICD-10-CM

## 2011-06-01 MED ORDER — TRAZODONE HCL 50 MG PO TABS: 50.0000 mg | ORAL_TABLET | Freq: Every day | ORAL | Status: DC

## 2011-06-01 NOTE — Patient Instructions (Addendum)
1.  We will continue to work on getting you over to see the ENT doctor.  2.  Stop in the pharmacy and pick up Generic Zyrtec (Cetirizine) 10 mg take 1 tablet daily.  3.  Start Trazodone 50 mg take 1 tablet right before bedtime.  4. Continue taking all your other medications.  5.  Follow up with your PCP in about 4 weeks to see how you are feeling.  If the Trazodone doesn't help you sleep please call and we can consider increasing it.

## 2011-06-01 NOTE — Telephone Encounter (Signed)
Thank you :)

## 2011-06-01 NOTE — Telephone Encounter (Signed)
Call from pt stating she does not feel any better from her last visit (05/24/11); continues to c/o fever,chills and congestion.  "I feel really bad."  She asked about referral to specialist (ENT); I told her it has been faxed to [facility]. Dr Tonny Branch has an opening this afternoon; he agreed to see pt; nurse made awared also. Pt was called back and informed of appt.

## 2011-06-01 NOTE — Progress Notes (Signed)
Subjective:   Patient ID: Heidi Galvan female   DOB: 1965-12-15 45 y.o.   MRN: [id number]  HPI: Heidi Galvan is a 45 y.o. woman with a long history of sinusitis.  She was seen by Dr. Denton Meek on 05/24/11 and was referred to ENT.  She states that her sinusitis seems to have a seasonal component to it with it being worse whenever the seasons change.  Lately it has been worse then usual.  She states that it is a pressure behind her eyes and around the maxillary sinus and frontal sinuses.  She has been using Flonase 2-3 times daily.  She denies drainage, post nasal drip, cough, or fever.    She also states that she is tired all the time.  She states she has trouble falling asleep and wakes up multiple times during the night.  She denies any SI, HI, racing thoughts, or snoring.    Past Medical History  Diagnosis Date  . Menometrorrhagia   . Migraine   . Depression   . Iron deficiency anemia    Current Outpatient Prescriptions  Medication Sig Dispense Refill  . escitalopram (LEXAPRO) 10 MG tablet Take 1 tablet (10 mg total) by mouth daily.  30 tablet  11  . ferrous fumarate (HEMOCYTE) 325 (106 FE) MG TABS Take 1 tablet by mouth 3 (three) times daily. With orange juice for anemia       . fluticasone (FLONASE) 50 MCG/ACT nasal spray 1 spray by Nasal route daily.  16 g  2  . loratadine (CLARITIN) 10 MG tablet Take 1 tablet (10 mg total) by mouth daily.  30 tablet  11  . sulindac (CLINORIL) 200 MG tablet Take 1 tablet (200 mg total) by mouth 2 (two) times daily.  60 tablet  5   Family History  Problem Relation Age of Onset  . Asthma Mother   . Hypertension Mother   . Asthma Daughter   . Asthma Son    History   Social History  . Marital Status: Married    Spouse Name: N/A    Number of Children: N/A  . Years of Education: N/A   Social History Main Topics  . Smoking status: Never Smoker   . Smokeless tobacco: None  . Alcohol Use: No  . Drug Use: No  . Sexually Active: No    Other Topics Concern  . None   Social History Narrative   Financial assistance approved for 100% discount at [facility] and has [facility] card per Deborah Hill10/102011   Review of Systems: Constitutional: Denies fever, chills, diaphoresis, appetite change and fatigue.  HEENT: Positive for sinus congestions.  Denies photophobia, eye pain, redness, hearing loss, ear pain, congestion, sore throat, rhinorrhea, sneezing, mouth sores, trouble swallowing, neck pain, neck stiffness and tinnitus.   Respiratory: Denies SOB, DOE, cough, chest tightness,  and wheezing.   Cardiovascular: Denies chest pain, palpitations and leg swelling.  Gastrointestinal: Denies nausea, vomiting, abdominal pain, diarrhea, constipation, blood in stool and abdominal distention.  Genitourinary: Denies dysuria, urgency, frequency, hematuria, flank pain and difficulty urinating.  Musculoskeletal: Denies myalgias, back pain, joint swelling, arthralgias and gait problem.  Skin: Denies pallor, rash and wound.  Neurological: Denies dizziness, seizures, syncope, weakness, light-headedness, numbness and headaches.  Hematological: Denies adenopathy. Easy bruising, personal or family bleeding history  Psychiatric/Behavioral: Denies suicidal ideation, mood changes, confusion, nervousness, sleep disturbance and agitation  Objective:  Physical Exam: Filed Vitals:   06/01/11 1541  BP: 139/97  Pulse: 80  Temp: 96.9 F (36.1  C)  TempSrc: Oral  Height: 5\' 1"  (1.549 m)  Weight: 182 lb 1.6 oz (82.6 kg)   Constitutional: Vital signs reviewed.  Patient is a well-developed and well-nourished woman in no acute distress and cooperative with exam. Alert and oriented x3.  Head: Normocephalic and atraumatic, there is pain to palpation over the maxillary and frontal sinuses.  Nose: Turbinate erythema with mild drainage and allergic polyps. Ear: TM normal bilaterally Mouth: no erythema or exudates, MMM Eyes: PERRL, EOMI, conjunctivae normal, No  scleral icterus.  Neck: Supple, Trachea midline normal ROM, No JVD, mass, thyromegaly, or carotid bruit present.  Cardiovascular: RRR, S1 normal, S2 normal, no MRG, pulses symmetric and intact bilaterally Pulmonary/Chest: CTAB, no wheezes, rales, or rhonchi Abdominal: Soft. Non-tender, non-distended, bowel sounds are normal, no masses, organomegaly, or guarding present.  GU: no CVA tenderness Musculoskeletal: No joint deformities, or erythema Hematology: no cervical, inginal, or axillary adenopathy.   Skin: Warm, dry and intact. No rash, cyanosis, or clubbing.  Psychiatric: Normal mood and affect. speech and behavior is normal. Judgment and thought content normal. Cognition and memory are normal.   Assessment & Plan:

## 2011-06-08 ENCOUNTER — Other Ambulatory Visit: Payer: Self-pay | Admitting: Obstetrics and Gynecology

## 2011-06-08 DIAGNOSIS — Z1231 Encounter for screening mammogram for malignant neoplasm of breast: Secondary | ICD-10-CM

## 2011-06-24 NOTE — Assessment & Plan Note (Signed)
She continues to struggle with her chronic sinusitis.  She has not seen the ENT yet.  We will try her on Zyrtec and see if that helps with the symptoms.  She will continue to use the Flonase and symptomatic treatment with NSAIDs and tylenol.

## 2011-06-24 NOTE — Assessment & Plan Note (Signed)
She has not been sleeping well and this could be contributing to her "all over" pain.  We will try trazodone at bedtime and slowly titrate up to an effective dose.

## 2011-07-03 ENCOUNTER — Encounter: Payer: Self-pay | Admitting: Internal Medicine

## 2011-07-03 ENCOUNTER — Ambulatory Visit (INDEPENDENT_AMBULATORY_CARE_PROVIDER_SITE_OTHER): Payer: Self-pay | Admitting: Internal Medicine

## 2011-07-03 DIAGNOSIS — R109 Unspecified abdominal pain: Secondary | ICD-10-CM

## 2011-07-03 DIAGNOSIS — F329 Major depressive disorder, single episode, unspecified: Secondary | ICD-10-CM

## 2011-07-03 DIAGNOSIS — E669 Obesity, unspecified: Secondary | ICD-10-CM

## 2011-07-03 DIAGNOSIS — F3289 Other specified depressive episodes: Secondary | ICD-10-CM

## 2011-07-03 LAB — POCT GLYCOSYLATED HEMOGLOBIN (HGB A1C): Hemoglobin A1C: 6.2

## 2011-07-03 MED ORDER — TRAZODONE 25 MG HALF TABLET: 50.0000 mg | ORAL_TABLET | Freq: Every day | ORAL | Status: DC

## 2011-07-03 NOTE — Patient Instructions (Signed)
Please, pick up a prescription for Trazadone for depression. Please, call with any questions.

## 2011-07-03 NOTE — Progress Notes (Signed)
Subjective:   Patient ID: Heidi Galvan female   DOB: [birth year] 45 y.o.   MRN: [id number]  HPI: HeidiHeidi Galvan is a 45 y.o.  Woman is here fore a follow up visit. Continues to be depressed ->was unable to fill in a Rx for a Lexapro due to cost issues. Denies SI/HI or mania. Patient is with chronic sinuisitis --was evaluated by ENT 07/02/11 --awaiting for a phone call from his office for further instructions ->imaging of sinuses and/or surgery?    Past Medical History  Diagnosis Date  . Menometrorrhagia   . Migraine   . Depression   . Iron deficiency anemia    Current Outpatient Prescriptions  Medication Sig Dispense Refill  . fluticasone (FLONASE) 50 MCG/ACT nasal spray 1 spray by Nasal route daily.  16 g  2  . traZODone (DESYREL) 25 mg TABS Take 1 tablet (50 mg total) by mouth at bedtime. Take one half of a tablet by mouth qhs. Please, write instructions in Spanish. Thank you.  30 tablet  6  . traZODone (DESYREL) 50 MG tablet Take 1 tablet (50 mg total) by mouth at bedtime.  30 tablet  3   Family History  Problem Relation Age of Onset  . Asthma Mother   . Hypertension Mother   . Asthma Daughter   . Asthma Son    History   Social History  . Marital Status: Married    Spouse Name: N/A    Number of Children: N/A  . Years of Education: N/A   Social History Main Topics  . Smoking status: Never Smoker   . Smokeless tobacco: None  . Alcohol Use: No  . Drug Use: No  . Sexually Active: No   Other Topics Concern  . None   Social History Narrative   Financial assistance approved for 100% discount at [facility] and has [facility] card per Deborah Hill10/102011   Review of Systems: Constitutional: Denies fever, chills, diaphoresis, appetite change and fatigue.  HEENT: Denies photophobia, eye pain, redness, hearing loss, ear pain, congestion, sore throat, rhinorrhea, sneezing, mouth sores, trouble swallowing, neck pain, neck stiffness and tinnitus.   Respiratory: Denies SOB,  DOE, cough, chest tightness,  and wheezing.   Cardiovascular: Denies chest pain, palpitations and leg swelling.  Gastrointestinal: Denies nausea, vomiting, abdominal pain, diarrhea, constipation, blood in stool and abdominal distention.  Genitourinary: Denies dysuria, urgency, frequency, hematuria, flank pain and difficulty urinating.  Musculoskeletal: Denies myalgias, back pain, joint swelling, arthralgias and gait problem.  Skin: Denies pallor, rash and wound.  Neurological: Denies dizziness, seizures, syncope, weakness, light-headedness, numbness and headaches.  Hematological: Denies adenopathy. Easy bruising, personal or family bleeding history  Psychiatric/Behavioral: Denies suicidal ideation, mood changes, confusion, nervousness, sleep disturbance and agitation  Objective:  Physical Exam: Filed Vitals:   07/03/11 1419  BP: 139/91  Pulse: 88  Temp: 97 F (36.1 C)  TempSrc: Oral  Height: 5\' 1"  (1.549 m)  Weight: 181 lb 12.8 oz (82.464 kg)   Constitutional: Vital signs reviewed.  Patient is a well-developed and well-nourished woman in no acute distress and cooperative with exam. Alert and oriented x3.  Head: atraumatic Ear: TM normal bilaterally Mouth: no erythema or exudates, MMM Eyes: PERRL, EOMI, conjunctivae normal, No scleral icterus.  Neck: Supple, Trachea midline normal ROM, No JVD, mass, thyromegaly, or carotid bruit present.  Cardiovascular: RRR, S1 normal, S2 normal, no MRG, pulses symmetric and intact bilaterally Pulmonary/Chest: CTAB, no wheezes, rales, or rhonchi Abdominal: Soft. Non-tender, non-distended, bowel sounds are normal, no masses,  organomegaly, or guarding present.  GU: no CVA tenderness Musculoskeletal: No joint deformities, erythema, or stiffness, ROM full and no nontender Hematology: no cervical, inginal, or axillary adenopathy.  Neurological: A&O x3, Strenght is normal and symmetric bilaterally, cranial nerve II-XII are grossly intact, no focal motor  deficit, sensory intact to light touch bilaterally.  Skin: Warm, dry and intact. No rash, cyanosis, or clubbing.  Psychiatric:Tearful; normal affect. speech and behavior is normal. Judgment and thought content normal. Cognition and memory are normal.   Assessment & Plan:    1. Depression. Denies SI/HI or mania. -patient never started Lexapro due to cost. -Will start Trazodone -instructed to call 911 and/or go to ED if feels worse  2. Obesity. -Strong FMHx of DM -will check HgbA1C today  3. Chronic simusitis. -has seen ENT on 07/02/11 -awaiting a phone call from his office for a limited CT of sinuses without CM -Heidi Galvan will contact ENT office to see if we can expedite the process.

## 2011-07-03 NOTE — Progress Notes (Deleted)
  Subjective:    Patient ID: Heidi Galvan, female    DOB: 04/15/1966, 45 y.o.   MRN: [id number]  HPI    Review of Systems     Objective:   Physical Exam        Assessment & Plan:   

## 2011-07-06 ENCOUNTER — Other Ambulatory Visit: Payer: Self-pay | Admitting: Internal Medicine

## 2011-07-06 DIAGNOSIS — J329 Chronic sinusitis, unspecified: Secondary | ICD-10-CM

## 2011-07-11 ENCOUNTER — Ambulatory Visit (HOSPITAL_COMMUNITY)
Admission: RE | Admit: 2011-07-11 | Discharge: 2011-07-11 | Disposition: A | Discharge status: Home / Self Care | Payer: Self-pay | Source: Ambulatory Visit | Attending: Obstetrics and Gynecology | Admitting: Obstetrics and Gynecology

## 2011-07-11 DIAGNOSIS — Z1231 Encounter for screening mammogram for malignant neoplasm of breast: Secondary | ICD-10-CM | POA: Insufficient documentation

## 2011-07-12 ENCOUNTER — Ambulatory Visit (HOSPITAL_COMMUNITY)
Admission: RE | Admit: 2011-07-12 | Discharge: 2011-07-12 | Disposition: A | Discharge status: Home / Self Care | Payer: Self-pay | Source: Ambulatory Visit | Attending: Internal Medicine | Admitting: Internal Medicine

## 2011-07-12 ENCOUNTER — Encounter (HOSPITAL_COMMUNITY): Payer: Self-pay

## 2011-07-12 DIAGNOSIS — J329 Chronic sinusitis, unspecified: Secondary | ICD-10-CM | POA: Insufficient documentation

## 2011-08-02 ENCOUNTER — Other Ambulatory Visit: Payer: Self-pay | Admitting: Dermatology

## 2011-09-13 ENCOUNTER — Encounter: Payer: Self-pay | Admitting: Internal Medicine

## 2011-09-13 ENCOUNTER — Ambulatory Visit (INDEPENDENT_AMBULATORY_CARE_PROVIDER_SITE_OTHER): Payer: Self-pay | Admitting: Internal Medicine

## 2011-09-13 DIAGNOSIS — R109 Unspecified abdominal pain: Secondary | ICD-10-CM

## 2011-09-13 DIAGNOSIS — R1031 Right lower quadrant pain: Secondary | ICD-10-CM

## 2011-09-13 LAB — CBC WITH DIFFERENTIAL/PLATELET
Basophils Absolute: 0 10*3/uL (ref 0.0–0.1)
HCT: 38.1 % (ref 36.0–46.0)
Hemoglobin: 12.9 g/dL (ref 12.0–15.0)
Lymphocytes Relative: 30 % (ref 12–46)
Monocytes Absolute: 0.3 10*3/uL (ref 0.1–1.0)
Monocytes Relative: 6 % (ref 3–12)
Neutro Abs: 3.1 10*3/uL (ref 1.7–7.7)
RDW: 14.3 % (ref 11.5–15.5)
WBC: 5 10*3/uL (ref 4.0–10.5)

## 2011-09-13 LAB — URINALYSIS, ROUTINE W REFLEX MICROSCOPIC
Hgb urine dipstick: NEGATIVE
Leukocytes, UA: NEGATIVE
Nitrite: NEGATIVE
Urobilinogen, UA: 0.2 mg/dL (ref 0.0–1.0)
pH: 5.5 (ref 5.0–8.0)

## 2011-09-13 LAB — COMPLETE METABOLIC PANEL WITH GFR
Alkaline Phosphatase: 65 U/L (ref 39–117)
BUN: 15 mg/dL (ref 6–23)
GFR, Est Non African American: >89 mL/min
Glucose, Bld: 99 mg/dL (ref 70–99)
Total Bilirubin: 0.5 mg/dL (ref 0.3–1.2)

## 2011-09-13 LAB — POCT URINALYSIS DIPSTICK
Blood, UA: NEGATIVE
Protein, UA: NEGATIVE
Spec Grav, UA: >=1.03
Urobilinogen, UA: 0.2

## 2011-09-13 LAB — POCT URINE PREGNANCY: Preg Test, Ur: NEGATIVE

## 2011-09-13 MED ORDER — DOXYCYCLINE HYCLATE 100 MG PO TABS: 100.0000 mg | ORAL_TABLET | Freq: Two times a day (BID) | ORAL | Status: AC

## 2011-09-13 MED ORDER — LIDOCAINE HCL 1 % IJ SOLN
250.0000 mg | Freq: Once | INTRAMUSCULAR | Status: AC
  Administered 2011-09-13: 250 mg via INTRAMUSCULAR

## 2011-09-13 MED ORDER — CEFTRIAXONE SODIUM 250 MG IJ SOLR: 250.0000 mg | Freq: Once | INTRAMUSCULAR | Status: DC

## 2011-09-13 NOTE — Progress Notes (Signed)
Site right buttock where Rocephin 250mg  was given - WNL. No redness or hives noted. Pt states feels fine. Dr Dorthula Rue aware. [facility] Clagg RN 09/13/11 3:10PM

## 2011-09-13 NOTE — Patient Instructions (Signed)
Please schedule a follow up appointment as needed- you may reschedule appointment on Monday if you are not getting better. Please bring your medication bottles with your next appointment. Please take your medicines as prescribed. Keep yourself hydrated.

## 2011-09-13 NOTE — Progress Notes (Signed)
Due to language barrier, an interpreter was present during the history-taking and subsequent discussion (and for part of the physical exam) with this patient. Interpreter Wyvonnia Dusky 09/13/2011 for Dr Cornell Barman for 11.15

## 2011-09-13 NOTE — Progress Notes (Signed)
Subjective:    Patient ID: Heidi Galvan, female    DOB: 01-Apr-1966, 46 y.o.   MRN: [id number]  HPI: 46 year old woman with past medical history significant for cholecystitis status post recent cholecystectomy, non - obstructing renal calculus in L kidney, numerous abdominal ultrasounds and CT's for abdominal pain in the past  comes to the clinic for abdominal pain for 2-3 weeks. Patient was spanish speaking and interpreter was present for translation.  Patient states that she has been having abdominal pain intermittently for last 2 -3weeks associated with intermittent nausea and vomiting. She reports having couple of episodes of vomiting yesterday - that she describes as nonbilious, nonbloody . She describes her abdominal pain as burning discomfort located in the lower abdomen, currently rates her pain 8/10 .She had oatmeal and apple this morning in her breakfast and tolerated that well. But she states that she hasn't drank any water since morning because she is worried that it could cause her to have vomiting. She reports constipation alternating with diarrhea since that time. Her last bowel movement was yesterday after she took some papaya for constipation. Denies noticing any blood. She states that her grandchild was sick about 1 month ago with stomach bug but denies any present sick contacts.  She reports having frequency and dysuria but denies any hematuria. She reports having some greenish yellow vaginal discharge for last month or so, to an extent that she has to use 1-2 pads per day. But denies having any fever, chills or dizziness.  Of note the patient was complaining of right lower quadrant and flank pain initially to me but when she was told that she has a very small non- obstructing stone in her left kidney- she started complaining of LLQ pain and tenderness on exam.    Review of Systems  Constitutional: Negative for fever and chills.  HENT: Negative for congestion, rhinorrhea and  postnasal drip.   Eyes: Negative for visual disturbance.  Respiratory: Negative for apnea, cough, choking, chest tightness and shortness of breath.   Cardiovascular: Negative for chest pain, palpitations and leg swelling.  Gastrointestinal: Positive for nausea, vomiting and abdominal pain.  Genitourinary: Positive for frequency and vaginal discharge.  Neurological: Negative for dizziness and numbness.  Hematological: Negative for adenopathy.       Objective:   Physical Exam  Constitutional: She is oriented to person, place, and time. She appears well-developed and well-nourished. No distress.  HENT:  Head: Normocephalic and atraumatic.  Mouth/Throat: No oropharyngeal exudate.  Eyes: Conjunctivae and EOM are normal. Pupils are equal, round, and reactive to light.  Neck: Normal range of motion. Neck supple. No JVD present. No tracheal deviation present. No thyromegaly present.  Cardiovascular: Normal rate, regular rhythm, normal heart sounds and intact distal pulses.  Exam reveals no gallop and no friction rub.   No murmur heard. Pulmonary/Chest: Effort normal and breath sounds normal. No stridor. No respiratory distress. She has no wheezes. She has no rales.  Abdominal: Soft. Bowel sounds are normal. She exhibits no distension. There is no tenderness. There is no rebound.       Mild tenderness to palpation in the lower abdomen.  Genitourinary:       Whitish vaginal discharge noticed, probable cervical motion or adnexal tenderness present  Musculoskeletal: Normal range of motion. She exhibits no edema and no tenderness.  Neurological: She is alert and oriented to person, place, and time. She has normal reflexes. No cranial nerve deficit. Coordination normal.  Skin: She is not  diaphoretic.          Assessment & Plan:

## 2011-09-13 NOTE — Assessment & Plan Note (Signed)
She complains of right lower quadrant pain that has been present for last 2-3 weeks. Of note when she was told about a small left renal calculus that was seen in her CT scan-she started complaining of diffuse pain all along her lower abdomen. Her symptoms have been associated with intermittent vomiting. On pelvic exam she was noticed to have whitish vaginal discharge of thin consistency,  ? adnexal and cervical motion tenderness. Differentials for her presentation included PID versus UTI vs pyelonephritis vs renal colic .UTI was ruled out with negative UA ,pyelonephritis also becomes  less likely in the absence of CVA tenderness and the results of UA. Given the exam findings would treat her for probable pelvic inflammatory disease with ceftriaxone and doxycycline. Given her history of mild rash to penicillin in the past but having her tolerating amoxicillin in the past without any difficulties - we would give her ceftriaxone shot but  keep a close eye on her for 30 minutes to see if she breaks out in any hives or rashes. Her case and exam  findings were discussed with Dr. Meredith Pel.  -Check  UA, wet prep, GC and chlamydia. -CBC, CMET, lipase, FOBT. - Empirically treat with ceftriaxone and doxycycline. Would change her antibiotics if needed based on results for wet prep. -She was advised to call the clinic if her symptoms doesn't improve or gets worse.  Update- Patient's CBC, CMP, lipase, FOBT were negative. She was informed about the results

## 2011-09-14 ENCOUNTER — Telehealth: Payer: Self-pay | Admitting: Internal Medicine

## 2011-09-14 DIAGNOSIS — N898 Other specified noninflammatory disorders of vagina: Secondary | ICD-10-CM

## 2011-09-14 MED ORDER — FLUCONAZOLE 150 MG PO TABS: ORAL_TABLET | ORAL | Status: DC

## 2011-09-14 NOTE — Telephone Encounter (Signed)
She returned call - she was explained lab results and prescription for fluconazole was called to pharmacy.

## 2011-09-14 NOTE — Telephone Encounter (Signed)
She was noticed to have candida on wet prep.  Treat with fluconazole.>>> I tried calling the patient but she did not answer.

## 2011-10-22 ENCOUNTER — Encounter: Payer: Self-pay | Admitting: Internal Medicine

## 2011-10-22 ENCOUNTER — Ambulatory Visit (INDEPENDENT_AMBULATORY_CARE_PROVIDER_SITE_OTHER): Payer: Self-pay | Admitting: Internal Medicine

## 2011-10-22 VITALS — BP 123/85 | HR 91 | Temp 97.8°F | Wt 179.7 lb

## 2011-10-22 DIAGNOSIS — J329 Chronic sinusitis, unspecified: Secondary | ICD-10-CM

## 2011-10-22 DIAGNOSIS — R112 Nausea with vomiting, unspecified: Secondary | ICD-10-CM

## 2011-10-22 DIAGNOSIS — R1011 Right upper quadrant pain: Secondary | ICD-10-CM

## 2011-10-22 LAB — CBC
HCT: 39.8 % (ref 36.0–46.0)
Hemoglobin: 13.1 g/dL (ref 12.0–15.0)
MCH: 29.3 pg (ref 26.0–34.0)
MCHC: 32.9 g/dL (ref 30.0–36.0)
RDW: 15.1 % (ref 11.5–15.5)

## 2011-10-22 LAB — HEPATIC FUNCTION PANEL
AST: 19 U/L (ref 0–37)
Alkaline Phosphatase: 70 U/L (ref 39–117)
Indirect Bilirubin: 0.5 mg/dL (ref 0.0–0.9)

## 2011-10-22 LAB — POCT URINALYSIS DIPSTICK
Ketones, UA: NEGATIVE
Spec Grav, UA: >=1.03
Urobilinogen, UA: 0.2
pH, UA: 5.5

## 2011-10-22 NOTE — Progress Notes (Signed)
Patient ID: Heidi Galvan, female   DOB: 21-Dec-1965, 46 y.o.   MRN: [id number] HPI:    1. RUQ abdominal pain for 3 months with an increased frequency escalating up to several times per week, postprandial, associated with N/V; no fever, chills. Hx of cholecystectomy 4 years ago. 2. Chronic sinusisits --seen ENT; no change in management. Patient is complaining of the frontal HA 3/10 in intensity without any aura, weakness, visual/speech deficits..  Review of Systems: Negative except per history of present illness  Physical Exam:  Nursing notes and vitals reviewed General:  alert, well-developed, and cooperative to examination.   Lungs:  normal respiratory effort, no accessory muscle use, normal breath sounds, no crackles, and no wheezes. Heart:  normal rate, regular rhythm, no murmurs, no gallop, and no rub.   Abdomen:  soft, non-tender, normal bowel sounds, no distention, no guarding, no rebound tenderness, no hepatomegaly, and no splenomegaly.   Extremities:  No cyanosis, clubbing, edema Neurologic:  alert & oriented X3, nonfocal exam  Meds: Medications Prior to Admission  Medication Sig Dispense Refill  . fluconazole (DIFLUCAN) 150 MG tablet Take 1 tab by mouth today and repeat one in 3 days if you continue to be symptomatic.  2 tablet  0  . fluticasone (FLONASE) 50 MCG/ACT nasal spray 1 spray by Nasal route daily.  16 g  2  . traZODone (DESYREL) 25 mg TABS Take 1 tablet (50 mg total) by mouth at bedtime. Take one half of a tablet by mouth qhs. Please, write instructions in Spanish. Thank you.  30 tablet  6  . traZODone (DESYREL) 50 MG tablet Take 1 tablet (50 mg total) by mouth at bedtime.  30 tablet  3   No current facility-administered medications on file as of 10/22/2011.    Allergies: Seasonal and Penicillins Past Medical History  Diagnosis Date  . Menometrorrhagia   . Migraine   . Depression   . Iron deficiency anemia    Past Surgical History  Procedure Date  . Dilation  and curettage of uterus 2012  . Cholecystectomy    Family History  Problem Relation Age of Onset  . Asthma Mother   . Hypertension Mother   . Asthma Daughter   . Asthma Son    History   Social History  . Marital Status: Married    Spouse Name: N/A    Number of Children: N/A  . Years of Education: N/A   Occupational History  . Not on file.   Social History Main Topics  . Smoking status: Former Smoker    Quit date: 09/12/1981  . Smokeless tobacco: Never Used  . Alcohol Use: No  . Drug Use: No  . Sexually Active: No   Other Topics Concern  . Not on file   Social History Narrative   Financial assistance approved for 100% discount at [facility] and has [facility] card per Gavin Pound Hill10/102011    A/P: 1. RUQ pain with N/V -patient is sp 4 years cholecystectomy -etiology: Retained intraductal stone? Vs fatty liver -LFT's, CBC -Liver US -->if no ductal dilatation, then need to consider other etiology. If dilated ->would need an MRCP. -f/u sp [place]  2. Chronic sinusitis -continue with saline nasal irrigations on PRN basis -Amoxyl 50 mg PO tid x 7 days with meals -avoid exposure to cold temperatures.

## 2011-10-26 ENCOUNTER — Ambulatory Visit (HOSPITAL_COMMUNITY)
Admission: RE | Admit: 2011-10-26 | Discharge: 2011-10-26 | Disposition: A | Discharge status: Home / Self Care | Payer: Self-pay | Source: Ambulatory Visit | Attending: Internal Medicine | Admitting: Internal Medicine

## 2011-10-26 DIAGNOSIS — R1011 Right upper quadrant pain: Secondary | ICD-10-CM | POA: Insufficient documentation

## 2011-10-26 DIAGNOSIS — Q619 Cystic kidney disease, unspecified: Secondary | ICD-10-CM | POA: Insufficient documentation

## 2011-11-08 ENCOUNTER — Encounter: Payer: Self-pay | Admitting: Internal Medicine

## 2011-11-09 ENCOUNTER — Encounter: Payer: Self-pay | Admitting: Internal Medicine

## 2011-11-09 ENCOUNTER — Ambulatory Visit (INDEPENDENT_AMBULATORY_CARE_PROVIDER_SITE_OTHER): Payer: Self-pay | Admitting: Internal Medicine

## 2011-11-09 VITALS — BP 118/80 | HR 74 | Temp 97.8°F | Wt 181.7 lb

## 2011-11-09 DIAGNOSIS — F329 Major depressive disorder, single episode, unspecified: Secondary | ICD-10-CM

## 2011-11-09 DIAGNOSIS — J309 Allergic rhinitis, unspecified: Secondary | ICD-10-CM

## 2011-11-09 DIAGNOSIS — G894 Chronic pain syndrome: Secondary | ICD-10-CM

## 2011-11-09 DIAGNOSIS — J45909 Unspecified asthma, uncomplicated: Secondary | ICD-10-CM

## 2011-11-09 DIAGNOSIS — R229 Localized swelling, mass and lump, unspecified: Secondary | ICD-10-CM

## 2011-11-09 DIAGNOSIS — M25561 Pain in right knee: Secondary | ICD-10-CM

## 2011-11-09 MED ORDER — TRAZODONE 25 MG HALF TABLET: 50.0000 mg | ORAL_TABLET | Freq: Every day | ORAL | Status: DC

## 2011-11-09 MED ORDER — FLUOXETINE HCL 20 MG PO CAPS: 20.0000 mg | ORAL_CAPSULE | Freq: Every day | ORAL | Status: AC

## 2011-11-09 NOTE — Progress Notes (Signed)
Patient ID: Heidi Galvan, female   DOB: 08-04-1965, 46 y.o.   MRN: [id number] HPI:    1. Left Upper back "painful knot." Hx of hemangioma per patient's report that was confirmed with an excisional Bx in 2005 back in [place]. Patient denies any fever, chills, weight loss. Pain is dull constantwith intermittent "shooting pains." Worse with ROM and improved with rest. Review of Systems: Negative except per history of present illness  Physical Exam:  Nursing notes and vitals reviewed General:  alert, well-developed, and cooperative to examination.   Lungs:  normal respiratory effort, no accessory muscle use, normal breath sounds, no crackles, and no wheezes. Heart:  normal rate, regular rhythm, no murmurs, no gallop, and no rub.   Abdomen:  soft, non-tender, normal bowel sounds, no distention, no guarding, no rebound tenderness, no hepatomegaly, and no splenomegaly.   Extremities:  No cyanosis, clubbing, edema Neurologic:  alert & oriented X3, nonfocal exam  Meds: Medications Prior to Admission  Medication Sig Dispense Refill  . FLUoxetine (PROZAC) 20 MG capsule Take 1 capsule (20 mg total) by mouth daily.  30 capsule  11  . fluticasone (FLONASE) 50 MCG/ACT nasal spray Place 1 spray into the nose daily.      Marland Kitchen DISCONTD: fluticasone (FLONASE) 50 MCG/ACT nasal spray 1 spray by Nasal route daily.  16 g  2  . DISCONTD: traZODone (DESYREL) 25 mg TABS Take 1 tablet (50 mg total) by mouth at bedtime. Take one half of a tablet by mouth qhs. Please, write instructions in Spanish. Thank you.  30 tablet  6  . DISCONTD: traZODone (DESYREL) 50 MG tablet Take 1 tablet (50 mg total) by mouth at bedtime.  30 tablet  3  . DISCONTD: traZODone (DESYREL) 50 MG tablet Take 50 mg by mouth at bedtime.       No current facility-administered medications on file as of 11/09/2011.    Allergies: Seasonal and Penicillins Past Medical History  Diagnosis Date  . Menometrorrhagia   . Migraine   . Depression   .  Iron deficiency anemia    Past Surgical History  Procedure Date  . Dilation and curettage of uterus 2012  . Cholecystectomy    Family History  Problem Relation Age of Onset  . Asthma Mother   . Hypertension Mother   . Asthma Daughter   . Asthma Son    History   Social History  . Marital Status: Married    Spouse Name: N/A    Number of Children: N/A  . Years of Education: N/A   Occupational History  . Not on file.   Social History Main Topics  . Smoking status: Former Smoker    Quit date: 09/12/1981  . Smokeless tobacco: Never Used  . Alcohol Use: No  . Drug Use: No  . Sexually Active: No   Other Topics Concern  . Not on file   Social History Narrative   Financial assistance approved for 100% discount at [facility] and has [facility] card per Gavin Pound Hill10/102011    A/P: 1. Left Upper back mass -Hx of hemangioma in 2005 (dx-ed in [place] per patient's report). -[place] of mass -referral to General surgeon  2. Allergic Rhinitis - D/C Claritin -Start Allegra-D  3. Asthma, mild-moderate -Albuterol HFA -RF on Singular

## 2011-11-09 NOTE — Patient Instructions (Signed)
Por favor, tomar todos sus medicamentos segn lo prescrito y Freight forwarder con cualquier pregunta. Seguimiento de Theatre manager. Earney Navy!!!!

## 2011-11-09 NOTE — Progress Notes (Signed)
Desyrel 25mg  rx faxed to [facility].

## 2011-11-12 ENCOUNTER — Other Ambulatory Visit: Payer: Self-pay | Admitting: Internal Medicine

## 2011-11-12 DIAGNOSIS — G4700 Insomnia, unspecified: Secondary | ICD-10-CM

## 2011-11-12 MED ORDER — TRAZODONE HCL 100 MG PO TABS: 50.0000 mg | ORAL_TABLET | Freq: Every day | ORAL | Status: AC

## 2011-12-11 ENCOUNTER — Encounter: Payer: Self-pay | Admitting: Internal Medicine

## 2011-12-11 ENCOUNTER — Ambulatory Visit (INDEPENDENT_AMBULATORY_CARE_PROVIDER_SITE_OTHER): Payer: Self-pay | Admitting: Internal Medicine

## 2011-12-11 VITALS — BP 124/77 | HR 77 | Temp 97.0°F | Ht 61.0 in | Wt 186.6 lb

## 2011-12-11 DIAGNOSIS — R1031 Right lower quadrant pain: Secondary | ICD-10-CM

## 2011-12-11 DIAGNOSIS — R739 Hyperglycemia, unspecified: Secondary | ICD-10-CM

## 2011-12-11 DIAGNOSIS — N760 Acute vaginitis: Secondary | ICD-10-CM

## 2011-12-11 DIAGNOSIS — Z79899 Other long term (current) drug therapy: Secondary | ICD-10-CM

## 2011-12-11 LAB — POCT URINALYSIS DIPSTICK
Blood, UA: NEGATIVE
Protein, UA: NEGATIVE
Urobilinogen, UA: 0.2
pH, UA: 5

## 2011-12-11 LAB — POCT GLYCOSYLATED HEMOGLOBIN (HGB A1C): Hemoglobin A1C: 6

## 2011-12-11 LAB — GLUCOSE, CAPILLARY: Glucose-Capillary: 126 mg/dL — ABNORMAL HIGH (ref 70–99)

## 2011-12-11 MED ORDER — FLUCONAZOLE 100 MG PO TABS: 100.0000 mg | ORAL_TABLET | Freq: Every day | ORAL | Status: AC

## 2011-12-11 NOTE — Patient Instructions (Signed)
Please, take all your medications as prescribed. Please, call with any questions and follow up if no improvement in Symptoms.  Por favor, tome todos los medicamentos segn lo recetado.  Por favor, llame a cualquier pregunta que pueda tener y hacer un seguimiento si no hay mejora de los sntomas.

## 2011-12-11 NOTE — Progress Notes (Signed)
Patient ID: Heidi Galvan, female   DOB: July 01, 1966, 46 y.o.   MRN: [id number] HPI:    Vaginal discharge of a 10 days duration with itching, and burning on urination. Denies any fever, chills, pelvic pain, urinary incontinenece, hematuria or sexual activity. Last pap? LMP as of May 2012 sp endometrial lining ablation due to DUB. Review of Systems: Negative except per history of present illness  Physical Exam:  Nursing notes and vitals reviewed General:  alert, well-developed, and cooperative to examination.   Lungs:  normal respiratory effort, no accessory muscle use, normal breath sounds, no crackles, and no wheezes. Heart:  normal rate, regular rhythm, no murmurs, no gallop, and no rub.   Abdomen:  soft, non-tender, normal bowel sounds, no distention, no guarding, no rebound tenderness, no hepatomegaly, and no splenomegaly.   GU: vaginal canal with rugae, moist and pink without lesions;  multiparous cervix without CMT, lesions and small white/clear discharge without any odor. No ovaries palpatied. Rectal tonus is 4/4 with an external hemorrhoid grade 2/4 at 12 o'clock position; FOBT neg. Extremities:  No cyanosis, clubbing, edema Neurologic:  alert & oriented X3, nonfocal exam  Meds: Current Outpatient Prescriptions on File Prior to Visit  Medication Sig Dispense Refill  . FLUoxetine (PROZAC) 20 MG capsule Take 1 capsule (20 mg total) by mouth daily.  30 capsule  11  . fluticasone (FLONASE) 50 MCG/ACT nasal spray Place 1 spray into the nose daily.      . traZODone (DESYREL) 100 MG tablet Take 0.5 tablets (50 mg total) by mouth at bedtime.  30 tablet  6    Allergies: Cholestatin and Penicillins Past Medical History  Diagnosis Date  . Menometrorrhagia   . Migraine   . Depression   . Iron deficiency anemia    Past Surgical History  Procedure Date  . Dilation and curettage of uterus 2012  . Cholecystectomy    Family History  Problem Relation Age of Onset  . Asthma Mother   .  Hypertension Mother   . Asthma Daughter   . Asthma Son    History   Social History  . Marital Status: Married    Spouse Name: N/A    Number of Children: N/A  . Years of Education: N/A   Occupational History  . Not on file.   Social History Main Topics  . Smoking status: Former Smoker    Quit date: 09/12/1981  . Smokeless tobacco: Never Used  . Alcohol Use: No  . Drug Use: No  . Sexually Active: No   Other Topics Concern  . Not on file   Social History Narrative   Financial assistance approved for 100% discount at [facility] and has [facility] card per Gavin [place]10/102011   A/P: # vaginitis -?candidiases vs STD -PAP, GC/Chlamydia, trichomonas, candida -empiric treatment with fluconazole 100 mg PO x1 -abstain from sexual intercourse until resolution of Sx. -repeat HgbA1C -?early DM, type 2 -f/u if no improvement.

## 2011-12-19 ENCOUNTER — Other Ambulatory Visit: Payer: Self-pay | Admitting: Dermatology

## 2011-12-25 ENCOUNTER — Telehealth: Payer: Self-pay | Admitting: *Deleted

## 2011-12-25 NOTE — Telephone Encounter (Signed)
Returned pt's call. States she's having teeth pain, mainly on the left side and at night. Also states left facial swelling; requesting to see a dentist. I told her she might need to be seen first. Please advise.  Thanks

## 2011-12-26 NOTE — Telephone Encounter (Signed)
Please, have her see the first available PCP. Thank you.

## 2011-12-27 NOTE — Telephone Encounter (Signed)
Appt has been scheduled 12/28/11 per front office.

## 2011-12-28 ENCOUNTER — Encounter: Payer: Self-pay | Admitting: Internal Medicine

## 2011-12-28 ENCOUNTER — Ambulatory Visit (INDEPENDENT_AMBULATORY_CARE_PROVIDER_SITE_OTHER): Payer: Self-pay | Admitting: Internal Medicine

## 2011-12-28 VITALS — BP 121/76 | HR 72 | Temp 97.5°F | Ht 61.0 in | Wt 186.8 lb

## 2011-12-28 DIAGNOSIS — K047 Periapical abscess without sinus: Secondary | ICD-10-CM

## 2011-12-28 MED ORDER — CLINDAMYCIN HCL 150 MG PO CAPS: 150.0000 mg | ORAL_CAPSULE | Freq: Four times a day (QID) | ORAL | Status: AC

## 2011-12-28 MED ORDER — NAPROXEN 500 MG PO TABS: 500.0000 mg | ORAL_TABLET | Freq: Two times a day (BID) | ORAL | Status: AC

## 2011-12-28 NOTE — Progress Notes (Signed)
  Subjective:    Patient ID: Heidi Galvan, female    DOB: 1965/09/15, 46 y.o.   MRN: [id number]  HPI Patient complains of left-sided upper mouth pain which started about 3 days ago. Pain is 10 out of 10 at its worse, no radiation, sharp, exacerbated by eating, relieved with over-the-counter NSAIDs, not associated with vomiting or nausea but associated with fever and chills with temperature as high as 102 for last 2 days. He should has been unable to sleep for last 2 days because of the severity of the pain.  Patient denies any changes in her mental status, vision changes, or difficulty hearing, new swelling in her neck, difficulty swallowing or any difficulty breathing.   Review of Systems  All other systems reviewed and are negative.       Objective:   Physical Exam  Constitutional: She is oriented to person, place, and time. She appears well-developed and well-nourished.  HENT:  Head: Normocephalic and atraumatic.  Mouth/Throat:    Neck: Normal range of motion. Neck supple.  Cardiovascular: Normal rate, regular rhythm and normal heart sounds.   Pulmonary/Chest: Effort normal and breath sounds normal. No respiratory distress. She has no wheezes. She has no rales.  Lymphadenopathy:    She has no cervical adenopathy.  Neurological: She is alert and oriented to person, place, and time.  Skin: Skin is warm and dry.          Assessment & Plan:

## 2011-12-28 NOTE — Assessment & Plan Note (Signed)
Patient has evidence of tooth abscess. She has [facility] card and is able to access medications from [facility]. We cannot prescribe her amoxicillin/clavulanate due to penicillin allergy.  Clindamycin for 7 days, naproxen twice a day mouthwash with over-the-counter solutions 3-4  times a day. Was given an appointment with dental clinic. Followup on an as-needed basis.

## 2011-12-28 NOTE — Patient Instructions (Signed)
Abscessed Tooth A tooth abscess is a collection of infected fluid (pus) from a bacterial infection in the inner part of the tooth (pulp). It usually occurs at the end of the tooth's root.  CAUSES   A very bad cavity (extensive tooth decay).   Trauma to the tooth, such as a broken or chipped tooth, that allows bacteria to enter into the pulp.  SYMPTOMS  Severe pain in and around the infected tooth.   Swelling and redness around the abscessed tooth or in the mouth or face.   Tenderness.   Pus drainage.   Bad breath.   Bitter taste in the mouth.   Difficulty swallowing.   Difficulty opening the mouth.   Feeling sick to your stomach (nauseous).   Vomiting.   Chills.   Swollen neck glands.  DIAGNOSIS  A medical and dental history will be taken.   An examination will be performed by tapping on the abscessed tooth.   X-rays may be taken of the tooth to identify the abscess.  TREATMENT The goal of treatment is to eliminate the infection.   You may be prescribed antibiotic medicine to stop the infection from spreading.   A root canal may be performed to save the tooth. If the tooth cannot be saved, it may be pulled (extracted) and the abscess may be drained.  HOME CARE INSTRUCTIONS  Only take over-the-counter or prescription medicines for pain, fever, or discomfort as directed by your caregiver.   Do not drive after taking pain medicine (narcotics).   Rinse your mouth (gargle) often with salt water ( tsp salt in 8 oz of warm water) to relieve pain or swelling.   Do not apply heat to the outside of your face.   Return to your dentist for further treatment as directed.  SEEK IMMEDIATE DENTAL CARE IF:  You have a temperature by mouth above 102 F (38.9 C), not controlled by medicine.   You have chills or a very bad headache.   You have problems breathing or swallowing.   Your have trouble opening your mouth.   You develop swelling in the neck or around the eye.    Your pain is not helped by medicine.   Your pain is getting worse instead of better.  Document Released: 07/09/2005 Document Revised: 06/28/2011 Document Reviewed: 10/17/2010 [employer] Patient Information 2012 [employer] 

## 2012-01-15 ENCOUNTER — Encounter: Payer: Self-pay | Admitting: Internal Medicine

## 2012-01-15 ENCOUNTER — Ambulatory Visit (INDEPENDENT_AMBULATORY_CARE_PROVIDER_SITE_OTHER): Payer: Self-pay | Admitting: Internal Medicine

## 2012-01-15 VITALS — BP 123/81 | HR 73 | Temp 97.7°F | Wt 185.2 lb

## 2012-01-15 DIAGNOSIS — R058 Other specified cough: Secondary | ICD-10-CM | POA: Insufficient documentation

## 2012-01-15 DIAGNOSIS — R05 Cough: Secondary | ICD-10-CM

## 2012-01-15 MED ORDER — AZITHROMYCIN 250 MG PO TABS: ORAL_TABLET | ORAL | Status: AC

## 2012-01-15 NOTE — Progress Notes (Signed)
Subjective:     Patient ID: Heidi Galvan, female   DOB: 06-Sep-1965, 46 y.o.   MRN: [id number]  HPI Patient is a very pleasant 46 year old woman with a history of depression and iron deficiency anemia who presents with acute illness.  The patient has been experiencing 3 days of progressively worsening cough productive of greenish sputum, fatigue, fever to 103 at home, pain across the back is worse with coughing, mild sore throat, mild left otalgia, and general malaise. Both her son and grandson have been diagnosed with acute bronchitis. She has tried Tylenol and Robitussin with some relief.  Review of Systems No chest pain, no abdominal pain, no dysuria    Objective:   Physical Exam VItal signs reviewed and stable. GEN: No apparent distress.  Alert and oriented x 3.  Pleasant, conversant, and cooperative to exam. HEENT: NCAT.  Neck is supple without palpable masses or lymphadenopathy.  EOMI.  PERRLA.  Sclerae anicteric.  Conjunctivae noninjected. MMM.  Oropharynx is without erythema, exudates, or other abnormal lesions. RESP:  Lungs are clear to ascultation bilaterally with good air movement.  No wheezes, ronchi, or rubs. CARDIOVASCULAR: regular rate, normal rhythm.  Clear S1, S2, no murmurs, gallops, or rubs. EXT: warm and dry.  No clubbing or cyanosis.  No edema in b/l lower extremeties SKIN: warm and dry with normal turgor.  No rashes or abnormal lesions observed.     Assessment:         Plan:

## 2012-01-15 NOTE — Assessment & Plan Note (Signed)
Patient with 3 days progressive productive cough as well as fevers and malaise. She does appear to have some pleuritic pain across her back. Her lungs are clear to auscultation, but clinically she fits the picture for either a bronchitis or a pneumonia. She has positive sick contacts. I think it is reasonable to treat her with an antibiotic, though this may not be curative if she has a viral syndrome. - Azithromycin 500 times one day, 250x4 days - Continue Tylenol - No x-ray at this time given empiric treatment - RTC as needed

## 2012-03-06 IMAGING — US US PELVIS COMPLETE
1 series · 14 of 25 positions shown · non-contrast
Comparison: [DATE]

CLINICAL DATA: Pelvic pain.  LMP 03/26/2011

TRANSABDOMINAL AND TRANSVAGINAL ULTRASOUND OF PELVIS
TECHNIQUE: Both transabdominal and transvaginal ultrasound
examinations of the pelvis were performed. Transabdominal technique
was performed for global imaging of the pelvis including uterus,
ovaries, adnexal regions, and pelvic cul-de-sac.

[Series 1: us pelvis complete · 14 of 52 slices shown]
[im 1/52]
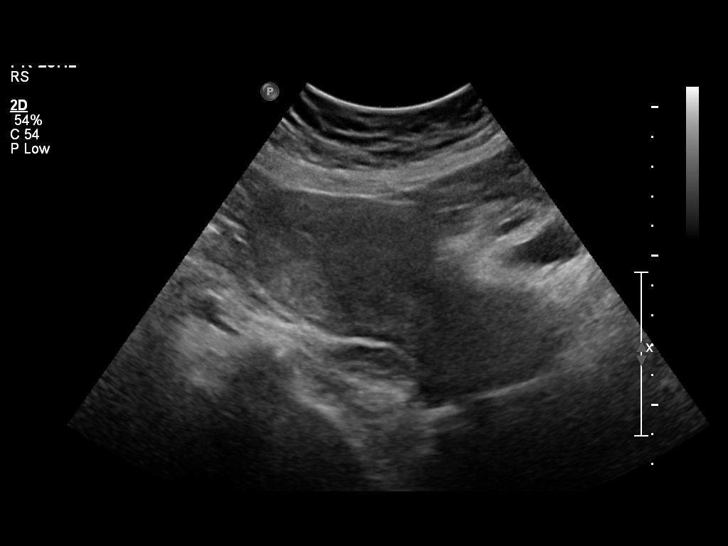
[im 5/52]
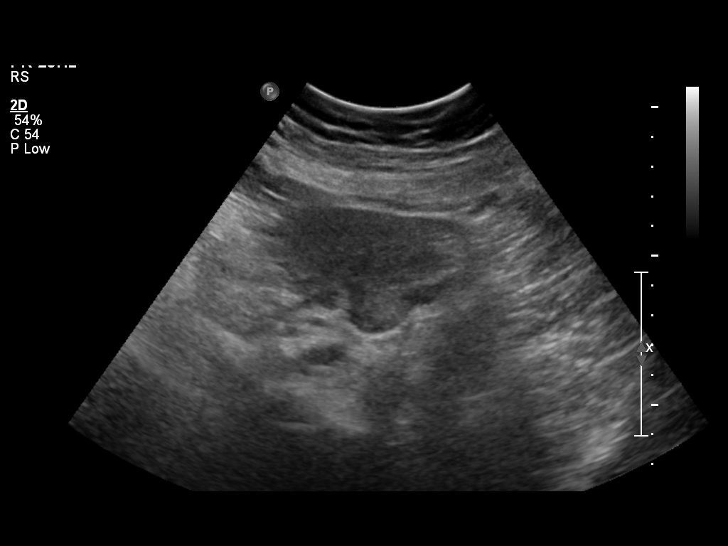
[im 9/52]
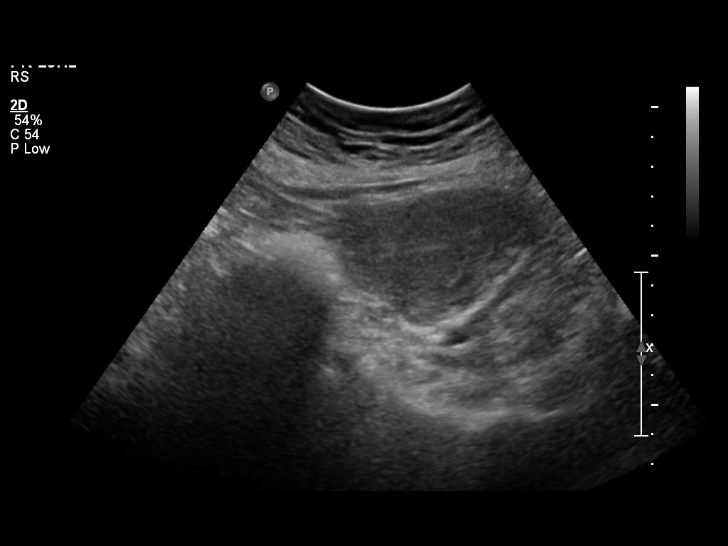
[im 13/52]
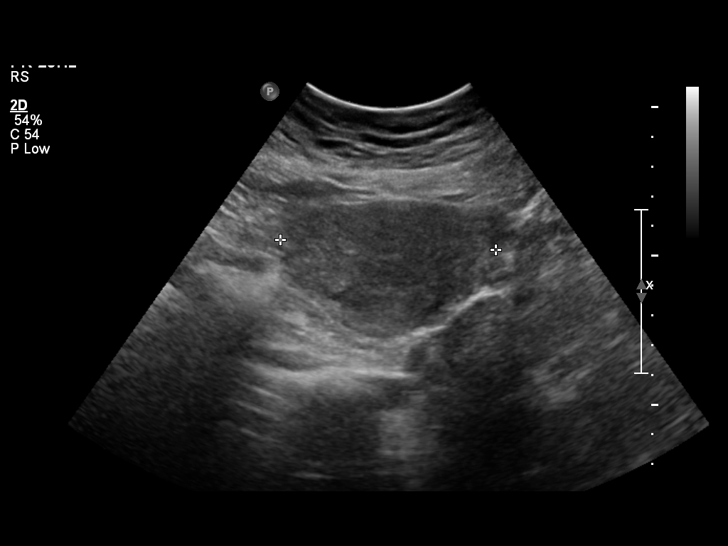
[im 18/52]
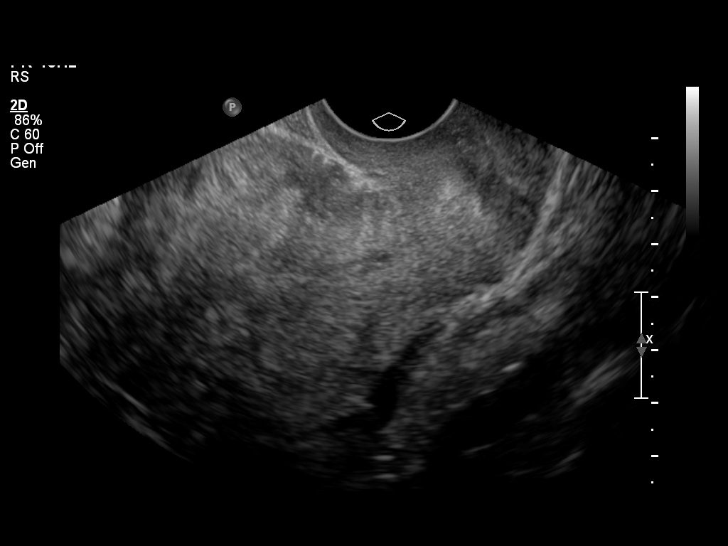
[im 20/52]
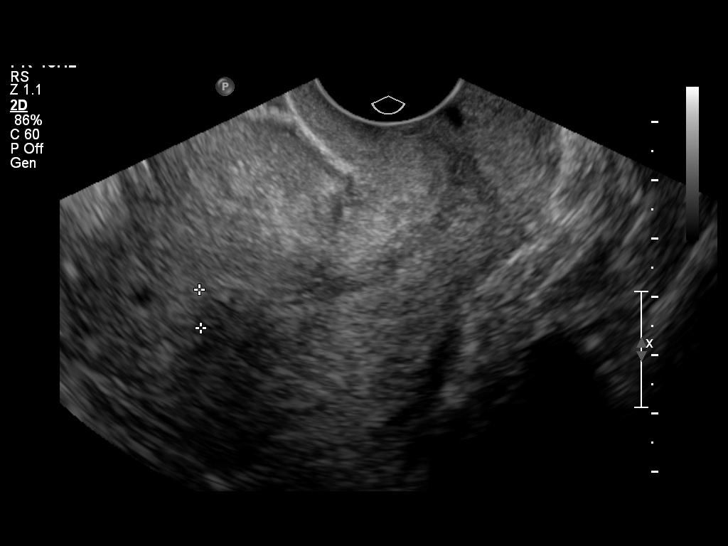
[im 24/52]
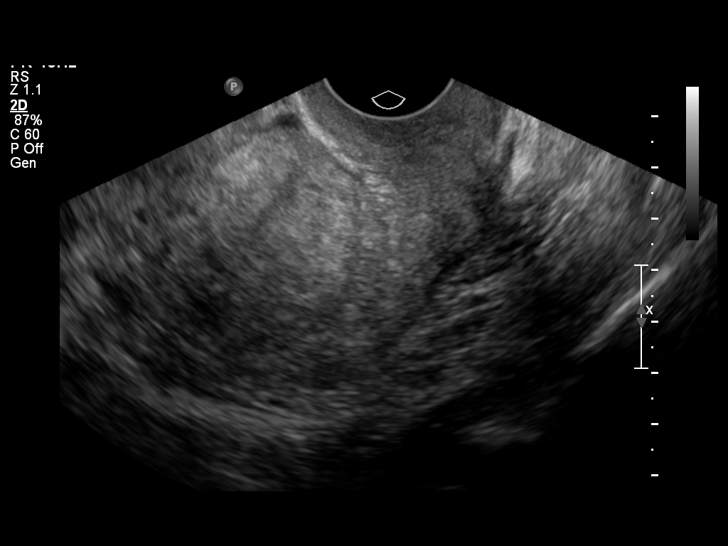
[im 28/52]
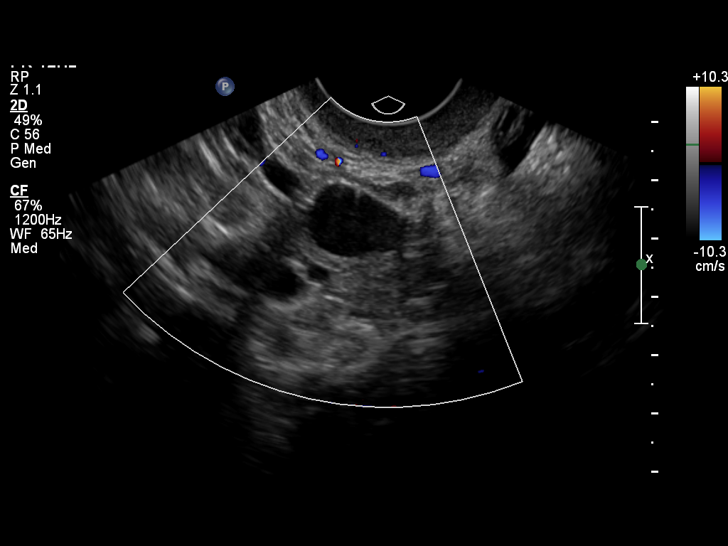
[im 32/52]
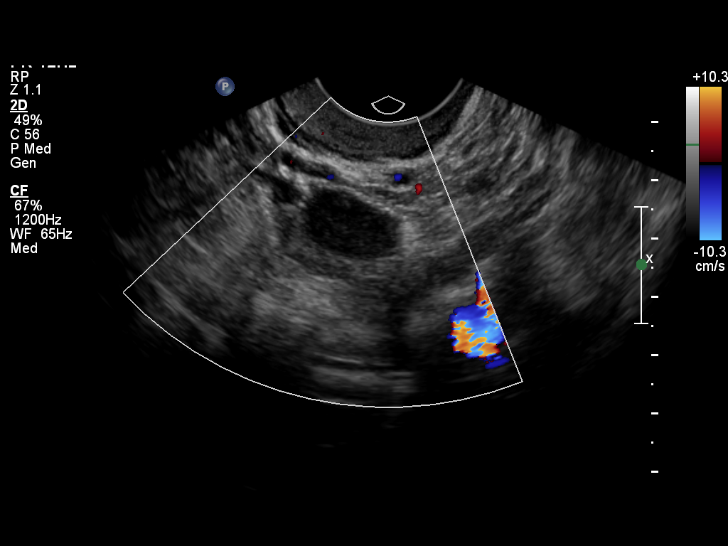
[im 35/52]
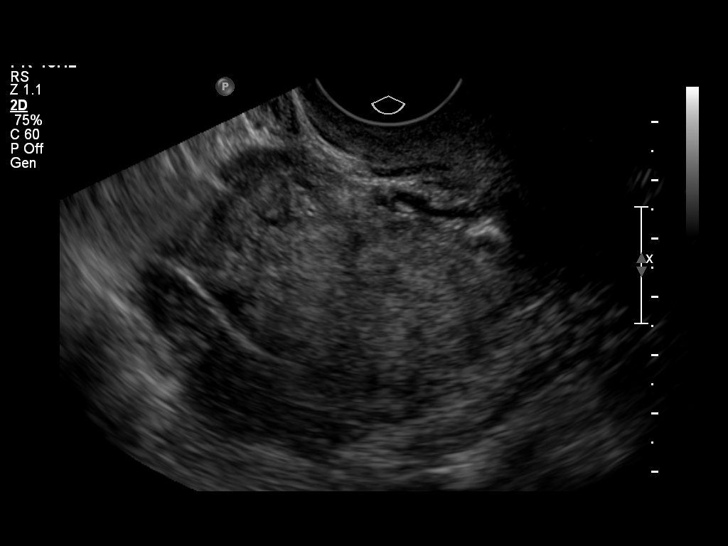
[im 39/52]
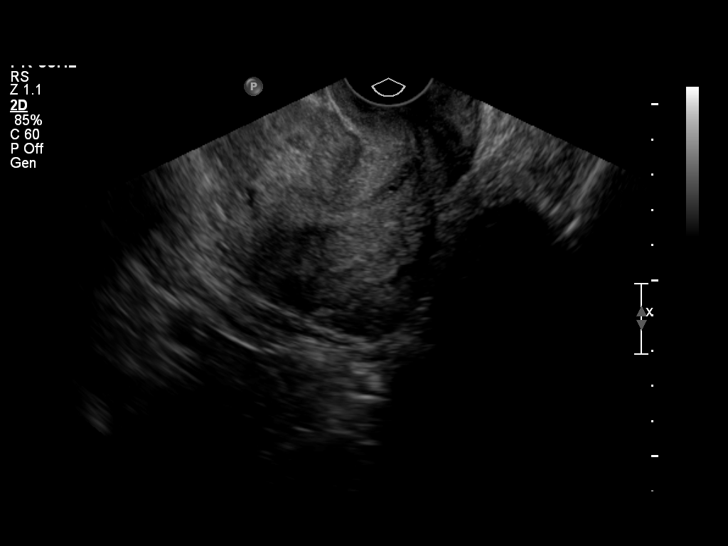
[im 43/52]
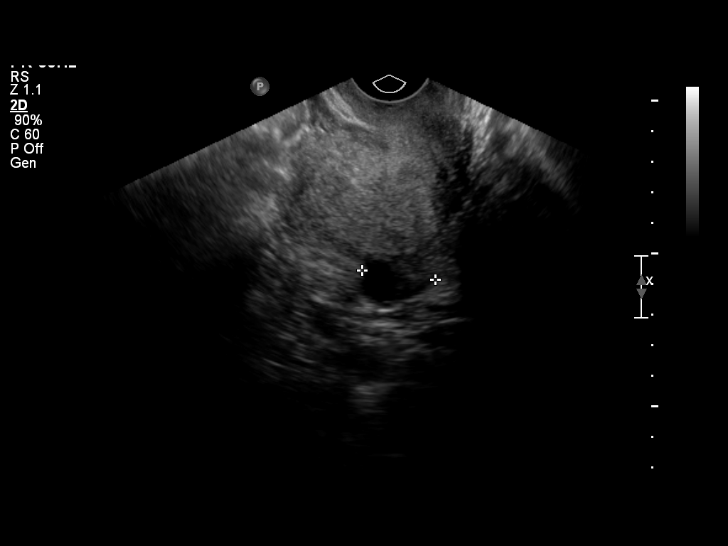
[im 47/52]
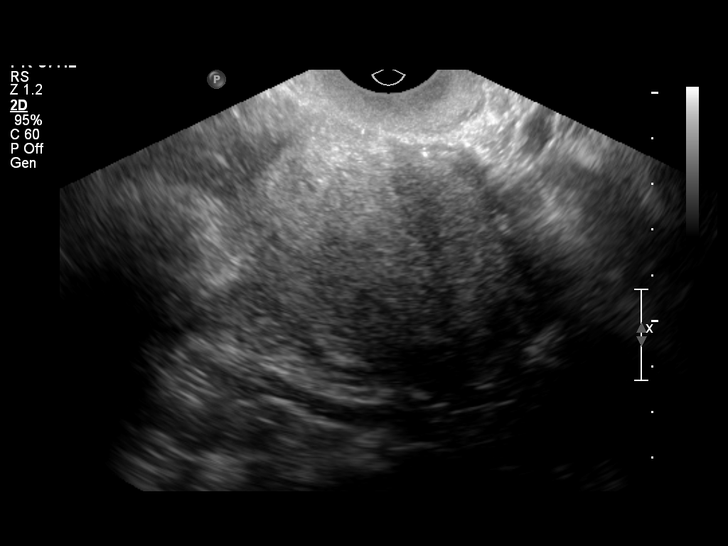
[im 52/52]
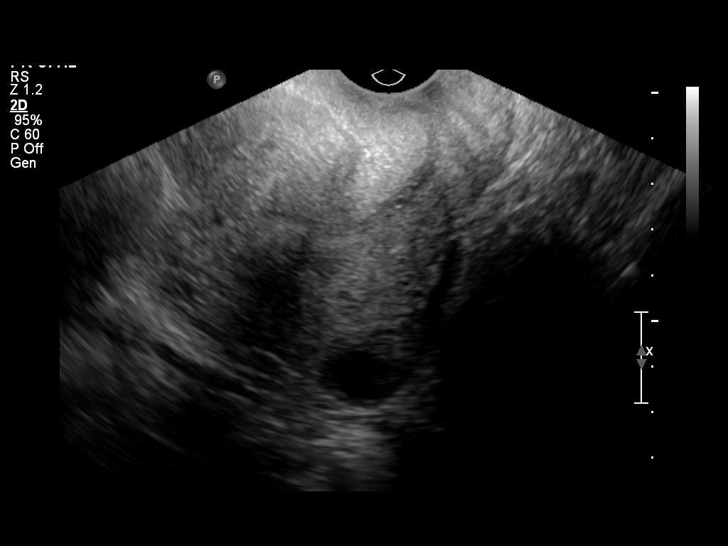

[14 of 25 positions shown; findings below may reference images not displayed]

It was necessary to proceed with endovaginal exam following the
transabdominal exam to visualize the myometrium, endometrium and
adnexa.
FINDINGS: Uterus: The uterus demonstrates a sagittal length of 9.40 cm, AP
depth of 5.80 cm and a transverse width of 6.2 cm. Nofocal
myometrial abnormality is noted.

Endometrium: Demonstrates an AP width of 6.5 mm with no areas of
focal thickening or heterogeneity

Right ovary:  Measures 2.8 x 2.8 x 2.7 cm and has a normal
appearance

Left ovary: Measures 3.1 x 1.5 x 2.4 cm and has a normal appearance

Other findings: No pelvic fluid or separate adnexal masses are
seen.
IMPRESSION: Unremarkable pelvic ultrasound with no worrisome focal abnormality
seen.

## 2012-04-14 ENCOUNTER — Ambulatory Visit (INDEPENDENT_AMBULATORY_CARE_PROVIDER_SITE_OTHER): Payer: Self-pay | Admitting: Internal Medicine

## 2012-04-14 ENCOUNTER — Ambulatory Visit: Payer: Self-pay | Admitting: Internal Medicine

## 2012-04-14 VITALS — BP 127/86 | HR 75 | Temp 97.9°F | Wt 189.6 lb

## 2012-04-14 DIAGNOSIS — J329 Chronic sinusitis, unspecified: Secondary | ICD-10-CM | POA: Insufficient documentation

## 2012-04-14 DIAGNOSIS — Z862 Personal history of diseases of the blood and blood-forming organs and certain disorders involving the immune mechanism: Secondary | ICD-10-CM

## 2012-04-14 DIAGNOSIS — N898 Other specified noninflammatory disorders of vagina: Secondary | ICD-10-CM

## 2012-04-14 LAB — CBC WITH DIFFERENTIAL/PLATELET
Basophils Absolute: 0 10*3/uL (ref 0.0–0.1)
Basophils Relative: 0 % (ref 0–1)
HCT: 37.6 % (ref 36.0–46.0)
Lymphocytes Relative: 22 % (ref 12–46)
MCHC: 34 g/dL (ref 30.0–36.0)
MCV: 84.5 fL (ref 78.0–100.0)
Monocytes Absolute: 0.4 10*3/uL (ref 0.1–1.0)
Monocytes Relative: 7 % (ref 3–12)
Neutro Abs: 4.5 10*3/uL (ref 1.7–7.7)
Neutrophils Relative %: 70 % (ref 43–77)
RBC: 4.45 MIL/uL (ref 3.87–5.11)
RDW: 14.5 % (ref 11.5–15.5)

## 2012-04-14 MED ORDER — AZITHROMYCIN 500 MG PO TABS: 500.0000 mg | ORAL_TABLET | Freq: Every day | ORAL | Status: AC

## 2012-04-14 MED ORDER — FLUTICASONE PROPIONATE 50 MCG/ACT NA SUSP: 1.0000 | Freq: Every day | NASAL | Status: DC

## 2012-04-14 NOTE — Assessment & Plan Note (Signed)
I performed GU exam, pap smear and will send for wet prep, GC Chlamydia and will treat accordingly -Will also send for urinalysis for dysuria

## 2012-04-14 NOTE — Patient Instructions (Addendum)
Will get labs today and I will call you with any abnormal labs Start taking Azithromycin one tablet daily x 2 weeks You can use flonase as prescribed Follow up with your primary care in 4 weeks if no improvement

## 2012-04-14 NOTE — Assessment & Plan Note (Signed)
Signs and symptoms of sinusitis.  She has been having frontal and maxillary tenderness with purulent discharge x 2 weeks. She is allergic to PCN therefore, cannot use augmentin or amoxil -Will treat with azithromycin 500mg  po qd x 14 days -Refill Flonase nasal sprays

## 2012-04-14 NOTE — Progress Notes (Signed)
HPI: Heidi Galvan is a 46 yo W with PMH of depression presents today for medication refills, headahce/sinus with purulent sputem x 1 week, severe, no photophobia or phonophobia, + nasal drainage, and vaginal discharge x 2 weeks in duration.  Vaginal discharge: Odor:no, ++ pruritis, Not sexually active, fever/chills/n/v. Some dysuria especially when she is itching.  She has been feeling fatigue as well and has a hx of iron deficiency anemia.   ROS: as per HPI  PE: General: alert, well-developed, and cooperative to examination.  Face:tenderness to palpation of frontal and maxillary sinuses. Nose: purulent discharge in both nostril with erythema. Ears: TM unremarkable Lungs: normal respiratory effort, no accessory muscle use, normal breath sounds, no crackles, and no wheezes. Heart: normal rate, regular rhythm, no murmur, no gallop, and no rub.  Abdomen: soft, +tender to left lower pelvic area, normal bowel sounds, no distention, no guarding, no rebound tenderness Msk: no joint swelling, no joint warmth, and no redness over joints.  Pulses: 2+ DP/PT pulses bilaterally Extremities: No cyanosis, clubbing, edema Neurologic: nonfocal Skin: turgor normal and no rashes.  GU: cervix with white discharge, no odors, no masses noted, normal cervical motion . No adnexal masses.  Mild tenderness to palpation of the left lower pelvic area Psych: Oriented X3, memory intact for recent and remote, normally interactive, good eye contact, + anxious appearing, and not depressed appearing.

## 2012-04-14 NOTE — Assessment & Plan Note (Signed)
Last Hb is wnl but with her fatigue, will repeat CBC and anemia panel since she did have a hx of iron deficiency.

## 2012-04-15 LAB — ANEMIA PANEL
ABS Retic: 67.7 10*3/uL (ref 19.0–186.0)
Ferritin: 26 ng/mL (ref 10–291)
Folate: >20 ng/mL
RBC.: 4.51 MIL/uL (ref 3.87–5.11)
Retic Ct Pct: 1.5 % (ref 0.4–2.3)
TIBC: 337 ug/dL (ref 250–470)
Vitamin B-12: 472 pg/mL (ref 211–911)

## 2012-04-15 LAB — URINALYSIS, ROUTINE W REFLEX MICROSCOPIC
Glucose, UA: NEGATIVE mg/dL
Hgb urine dipstick: NEGATIVE
Leukocytes, UA: NEGATIVE
Nitrite: NEGATIVE
Protein, ur: NEGATIVE mg/dL
pH: 6 (ref 5.0–8.0)

## 2012-04-16 ENCOUNTER — Other Ambulatory Visit: Payer: Self-pay | Admitting: Internal Medicine

## 2012-04-16 MED ORDER — FERROUS SULFATE 325 (65 FE) MG PO TABS: 325.0000 mg | ORAL_TABLET | Freq: Three times a day (TID) | ORAL | Status: DC

## 2012-05-19 ENCOUNTER — Ambulatory Visit (INDEPENDENT_AMBULATORY_CARE_PROVIDER_SITE_OTHER): Payer: Self-pay | Admitting: Internal Medicine

## 2012-05-19 VITALS — BP 115/80 | HR 74 | Temp 98.6°F | Ht 61.0 in | Wt 190.3 lb

## 2012-05-19 DIAGNOSIS — J309 Allergic rhinitis, unspecified: Secondary | ICD-10-CM

## 2012-05-19 DIAGNOSIS — J069 Acute upper respiratory infection, unspecified: Secondary | ICD-10-CM

## 2012-05-19 MED ORDER — SALINE NASAL SPRAY 0.65 % NA SOLN: 1.0000 | NASAL | Status: AC | PRN

## 2012-05-19 MED ORDER — FEXOFENADINE-PSEUDOEPHED ER 60-120 MG PO TB12: 1.0000 | ORAL_TABLET | Freq: Two times a day (BID) | ORAL | Status: DC | PRN

## 2012-05-19 NOTE — Patient Instructions (Addendum)
-  stop fluticasone. -take allegra-d twice daily as needed for symptoms. -use saline nasal spray as needed. -return in 3-4 weeks if not better.

## 2012-05-19 NOTE — Progress Notes (Signed)
  Subjective:    Patient ID: Heidi Galvan, female    DOB: 08-27-1965, 46 y.o.   MRN: [id number]  HPI Presents today with 5-6 day history of headache, diffuse myalgias, difficulty sleeping, fever once in past 1 week (101 F). She admits to a cough and a sore throat. She has taken tylenol which has helped "a little bit". Her cough is nonproductive. She overall feels better than last week. No fever today. She admits her nose is dry and has been using a nasal steroid very often. Overall she is getting better. She admits her biggest complaints now are allergic symptoms and significant nasal congestion.  Review of Systems Otherwise negative except for that stated in the HPI.    Objective:   Physical Exam Filed Vitals:   05/19/12 0949  BP: 115/80  Pulse: 74  Temp: 98.6 F (37 C)   GEN: AAOx3, NAD. HEENT: EOMI, mild tenderness over frontal and maxillary sinuses, PERRLA, no oropharyngeal erythema. No adenopathy. CV: S1S2, no m/r/g, RRR. PULM: CTA bilat.     Assessment & Plan:  46 yr. Old female w/ hx depression, iron deficiency, hx migraine hx, recently treated for presumed sinusitis, presents today due to recent URI and possibly component of allergic rhinitis. 1) Allergic rhinitis/recent URI: For now would discontinue nasal steroid, this is drying out her nose. She should use saline nasal spray. Start Allegra-D 60/120mg  po bid PRN. I don't think she has sinusitis, recently treated with a course of abx. Return to clinic if not improved in 3-4 weeks.  Jonah Blue

## 2012-05-26 ENCOUNTER — Other Ambulatory Visit: Payer: Self-pay | Admitting: Dermatology

## 2012-05-30 ENCOUNTER — Other Ambulatory Visit: Payer: Self-pay | Admitting: Internal Medicine

## 2012-05-30 DIAGNOSIS — Z1231 Encounter for screening mammogram for malignant neoplasm of breast: Secondary | ICD-10-CM

## 2012-06-02 ENCOUNTER — Ambulatory Visit (INDEPENDENT_AMBULATORY_CARE_PROVIDER_SITE_OTHER): Payer: Self-pay | Admitting: *Deleted

## 2012-06-02 DIAGNOSIS — Z299 Encounter for prophylactic measures, unspecified: Secondary | ICD-10-CM

## 2012-06-02 DIAGNOSIS — Z23 Encounter for immunization: Secondary | ICD-10-CM

## 2012-07-11 ENCOUNTER — Ambulatory Visit (HOSPITAL_COMMUNITY): Payer: Self-pay

## 2012-08-05 ENCOUNTER — Ambulatory Visit (HOSPITAL_COMMUNITY): Payer: Self-pay

## 2012-08-13 ENCOUNTER — Ambulatory Visit (HOSPITAL_COMMUNITY): Payer: Self-pay

## 2012-08-18 ENCOUNTER — Ambulatory Visit: Payer: Self-pay | Admitting: Internal Medicine

## 2012-08-19 ENCOUNTER — Encounter: Payer: Self-pay | Admitting: Internal Medicine

## 2012-08-19 ENCOUNTER — Ambulatory Visit: Payer: Self-pay

## 2012-08-19 ENCOUNTER — Ambulatory Visit (INDEPENDENT_AMBULATORY_CARE_PROVIDER_SITE_OTHER): Payer: Self-pay | Admitting: Internal Medicine

## 2012-08-19 ENCOUNTER — Ambulatory Visit (HOSPITAL_COMMUNITY): Payer: Self-pay

## 2012-08-19 VITALS — BP 117/75 | HR 69 | Temp 97.8°F | Ht 61.0 in | Wt 190.7 lb

## 2012-08-19 DIAGNOSIS — J329 Chronic sinusitis, unspecified: Secondary | ICD-10-CM

## 2012-08-19 DIAGNOSIS — F329 Major depressive disorder, single episode, unspecified: Secondary | ICD-10-CM

## 2012-08-19 MED ORDER — FLUTICASONE PROPIONATE 50 MCG/ACT NA SUSP: 1.0000 | Freq: Every day | NASAL | Status: DC

## 2012-08-19 MED ORDER — FEXOFENADINE HCL 60 MG PO TABS: 60.0000 mg | ORAL_TABLET | Freq: Every day | ORAL | Status: DC

## 2012-08-19 MED ORDER — AZITHROMYCIN 500 MG PO TABS: 500.0000 mg | ORAL_TABLET | Freq: Every day | ORAL | Status: AC

## 2012-08-19 NOTE — Patient Instructions (Addendum)
1. Please stop taking Allegra-D and start taking Allegra from now. Please start taking Azithromycin for 7 days as prescribed, and start using Flonase. Please continue using saline spray. 2. After your symptoms resolves, please ask our doctor whether you need to do sleep study. 3. If you have worsening of your symptoms or new symptoms arise, please call the clinic ([phone]), or go to the ER immediately if symptoms are severe.

## 2012-08-19 NOTE — Assessment & Plan Note (Signed)
It is stable. The patient does not have new symptoms. No suicidal or homicidal ideation. We'll continue Prozac.

## 2012-08-19 NOTE — Progress Notes (Signed)
Interpreter Heidi Galvan for DR Vedia Pereyra

## 2012-08-19 NOTE — Progress Notes (Signed)
Patient ID: Tera Pellicane, female   DOB: 11-24-1965, 47 y.o.   MRN: [id number] Subjective:   Patient ID: Makaylie Dedeaux female   DOB: [birth year] 47 y.o.   MRN: [id number]  CC:  Acute visit, headache and nasal drainage HPI:  Ms.Saige Saur is a 47 y.o. lady with past medical history as outlined below, who presents for an acute visit.  Patient does not speak [place]. She is accompanied by interpreter today. Patient has chronic sinusitis for >3 years. She CT maxillofacial in 2012 trial which showed left maxillary sinus mucosal thickening and the frontal sinus hypoplastic change. Patient has been taking saline nasal spray and Allegra-D. She reports that she has worsening headache and nasal drainage for more than 2 weeks. Her headache is located at the frontal areas. It is aggravated with odors. She has yellow-green colored nasal discharge. She also has occasional mild nasal bleeding in the past 2 weeks. She feels like her right ear is muffled recently. She does not have fever, but has chills. She says she is still using nasal saline spray without any relief.  Patient also reported that she has a poor sleep in the past 2 weeks since her nasal drainage gets worse. She wakes up 4-5 times in the night and gasps for air. She feels sleepy and tired during the daytime. She could not tell accurately whether she has this problem chronically.  Denies cough, chest pain, abdominal pain,diarrhea, constipation, dysuria, urgency, frequency, hematuria, joint pain or leg swelling.   Past Medical History  Diagnosis Date  . Menometrorrhagia   . Migraine   . Depression   . Iron deficiency anemia    Current Outpatient Prescriptions  Medication Sig Dispense Refill  . azithromycin (ZITHROMAX) 500 MG tablet Take 1 tablet (500 mg total) by mouth daily. Take 1 tablet daily for 7 days.  7 tablet  0  . ferrous sulfate 325 (65 FE) MG tablet Take 1 tablet (325 mg total) by mouth 3 (three) times daily with meals.   270 tablet  3  . fexofenadine (ALLEGRA) 60 MG tablet Take 1 tablet (60 mg total) by mouth daily.  60 tablet  1  . FLUoxetine (PROZAC) 20 MG capsule Take 1 capsule (20 mg total) by mouth daily.  30 capsule  11  . fluticasone (FLONASE) 50 MCG/ACT nasal spray Place 1 spray into the nose daily.  16 g  3  . naproxen (NAPROSYN) 500 MG tablet Take 1 tablet (500 mg total) by mouth 2 (two) times daily with a meal.  30 tablet  0  . sodium chloride (OCEAN NASAL SPRAY) 0.65 % nasal spray Place 1 spray into the nose as needed for congestion.  45 mL  3  . traZODone (DESYREL) 100 MG tablet Take 0.5 tablets (50 mg total) by mouth at bedtime.  30 tablet  6   Family History  Problem Relation Age of Onset  . Asthma Mother   . Hypertension Mother   . Asthma Daughter   . Asthma Son    History   Social History  . Marital Status: Married    Spouse Name: N/A    Number of Children: N/A  . Years of Education: N/A   Social History Main Topics  . Smoking status: Former Smoker    Quit date: 09/12/1981  . Smokeless tobacco: Never Used  . Alcohol Use: No  . Drug Use: No  . Sexually Active: No   Other Topics Concern  . None   Social History Narrative  Financial assistance approved for 100% discount at [facility] and has [facility] card per Gavin [place]10/102011    Review of Systems: as per HPI  Objective:  Physical Exam: Filed Vitals:   08/19/12 1524  BP: 117/75  Pulse: 69  Temp: 97.8 F (36.6 C)  TempSrc: Oral  Height: 5\' 1"  (1.549 m)  Weight: 190 lb 11.2 oz (86.501 kg)  SpO2: 95%   Constitutional: Vital signs reviewed.  Patient is a well-developed and well-nourished, in no acute distress and cooperative with exam. Alert and oriented x3.  HEENT:  Head: Normocephalic and atraumatic Face: tenderness to palpation of frontal sinuses. Nose: purulent discharge in both nostril with erythema. There is little dried blood in left nares. Ear: TM normal bilaterally Mouth: no erythema or exudates, MMM Eyes:  PERRL, EOMI, conjunctivae normal, No scleral icterus.  Neck: Supple, Trachea midline normal ROM, No JVD, mass, thyromegaly, or carotid bruit present. No lymph node enlargement. Cardiovascular: RRR, S1 normal, S2 normal, no MRG, pulses symmetric and intact bilaterally Pulmonary/Chest: CTAB, no wheezes, rales, or rhonchi Abdominal: Soft. Non-tender, non-distended, bowel sounds are normal, no masses, organomegaly, or guarding present.  GU: no CVA tenderness Musculoskeletal: No joint deformities, erythema, or stiffness, ROM full and non-tender Hematology: no cervical, inginal, or axillary adenopathy.  Neurological: A&O x3, Strength is normal and symmetric bilaterally, cranial nerve II-XII are grossly intact, no focal motor deficit, sensory intact to light touch bilaterally.  Skin: Warm, dry and intact. No rash, cyanosis, or clubbing.  Psychiatric: Normal mood and affect. speech and behavior is normal. Judgment and thought content normal. Cognition and memory are normal.    Assessment & Plan:

## 2012-08-19 NOTE — Assessment & Plan Note (Signed)
Signs and symptoms are consistent with sinusitis. She has been having frontal tenderness with purulent discharge x 2 weeks. She is allergic to PCN therefore, cannot use augmentin or amoxil. Another possibility is possible OSA which may have contributed to her HA. Patient has symptoms for OSA, but she could not tell accurately how long her symptoms have been going on. Need to follow up for possible sleep study after her acute symptoms resolved.  -Will treat with azithromycin 500mg  po qd x 7days  -Start Flonase nasal sprays -D/c Allegra-d and start Allegra -continue nasal saline spray.

## 2012-09-02 ENCOUNTER — Ambulatory Visit (HOSPITAL_COMMUNITY): Payer: Self-pay

## 2012-09-18 ENCOUNTER — Ambulatory Visit (HOSPITAL_COMMUNITY): Payer: Self-pay

## 2013-04-02 ENCOUNTER — Encounter (HOSPITAL_COMMUNITY): Payer: Self-pay

## 2013-04-02 ENCOUNTER — Ambulatory Visit (INDEPENDENT_AMBULATORY_CARE_PROVIDER_SITE_OTHER): Payer: Self-pay | Admitting: Internal Medicine

## 2013-04-02 ENCOUNTER — Ambulatory Visit (HOSPITAL_COMMUNITY)
Admission: RE | Admit: 2013-04-02 | Discharge: 2013-04-02 | Disposition: A | Discharge status: Home / Self Care | Payer: Self-pay | Source: Ambulatory Visit | Attending: Internal Medicine | Admitting: Internal Medicine

## 2013-04-02 ENCOUNTER — Other Ambulatory Visit: Payer: Self-pay | Admitting: Internal Medicine

## 2013-04-02 ENCOUNTER — Encounter: Payer: Self-pay | Admitting: Internal Medicine

## 2013-04-02 VITALS — BP 131/88 | HR 75 | Temp 97.4°F | Ht 60.0 in | Wt 173.1 lb

## 2013-04-02 DIAGNOSIS — Z9089 Acquired absence of other organs: Secondary | ICD-10-CM | POA: Insufficient documentation

## 2013-04-02 DIAGNOSIS — R109 Unspecified abdominal pain: Secondary | ICD-10-CM | POA: Insufficient documentation

## 2013-04-02 DIAGNOSIS — N289 Disorder of kidney and ureter, unspecified: Secondary | ICD-10-CM | POA: Insufficient documentation

## 2013-04-02 DIAGNOSIS — K449 Diaphragmatic hernia without obstruction or gangrene: Secondary | ICD-10-CM | POA: Insufficient documentation

## 2013-04-02 DIAGNOSIS — R112 Nausea with vomiting, unspecified: Secondary | ICD-10-CM | POA: Insufficient documentation

## 2013-04-02 DIAGNOSIS — I998 Other disorder of circulatory system: Secondary | ICD-10-CM | POA: Insufficient documentation

## 2013-04-02 DIAGNOSIS — N200 Calculus of kidney: Secondary | ICD-10-CM | POA: Insufficient documentation

## 2013-04-02 DIAGNOSIS — K439 Ventral hernia without obstruction or gangrene: Secondary | ICD-10-CM | POA: Insufficient documentation

## 2013-04-02 LAB — CBC WITH DIFFERENTIAL/PLATELET
Basophils Relative: 0 % (ref 0–1)
Eosinophils Absolute: 0.1 10*3/uL (ref 0.0–0.7)
Eosinophils Relative: 1 % (ref 0–5)
Hemoglobin: 12.6 g/dL (ref 12.0–15.0)
MCH: 28.5 pg (ref 26.0–34.0)
MCHC: 33.6 g/dL (ref 30.0–36.0)
Monocytes Absolute: 0.4 10*3/uL (ref 0.1–1.0)
Monocytes Relative: 6 % (ref 3–12)
Neutrophils Relative %: 63 % (ref 43–77)

## 2013-04-02 LAB — URINALYSIS, ROUTINE W REFLEX MICROSCOPIC
Glucose, UA: NEGATIVE mg/dL
Hgb urine dipstick: NEGATIVE
Ketones, ur: NEGATIVE mg/dL
pH: 5.5 (ref 5.0–8.0)

## 2013-04-02 LAB — POCT URINE PREGNANCY: Preg Test, Ur: NEGATIVE

## 2013-04-02 LAB — POCT URINALYSIS DIPSTICK
Glucose, UA: NEGATIVE
Nitrite, UA: NEGATIVE
Urobilinogen, UA: 0.2

## 2013-04-02 LAB — URINALYSIS, MICROSCOPIC ONLY: Casts: NONE SEEN

## 2013-04-02 LAB — COMPREHENSIVE METABOLIC PANEL
AST: 21 U/L (ref 0–37)
Albumin: 4 g/dL (ref 3.5–5.2)
Alkaline Phosphatase: 67 U/L (ref 39–117)
BUN: 16 mg/dL (ref 6–23)
Potassium: 3.5 mEq/L (ref 3.5–5.3)
Sodium: 136 mEq/L (ref 135–145)

## 2013-04-02 LAB — LIPASE: Lipase: 43 U/L (ref 11–59)

## 2013-04-02 MED ORDER — IOHEXOL 300 MG/ML  SOLN
100.0000 mL | Freq: Once | INTRAMUSCULAR | Status: AC | PRN
  Administered 2013-04-02: 100 mL via INTRAVENOUS

## 2013-04-02 MED ORDER — IOHEXOL 300 MG/ML  SOLN
50.0000 mL | Freq: Once | INTRAMUSCULAR | Status: AC | PRN
  Administered 2013-04-02: 50 mL via ORAL

## 2013-04-02 MED ORDER — TRAMADOL HCL 50 MG PO TABS: 50.0000 mg | ORAL_TABLET | Freq: Three times a day (TID) | ORAL | Status: AC | PRN

## 2013-04-02 MED ORDER — ONDANSETRON HCL 4 MG PO TABS: 4.0000 mg | ORAL_TABLET | Freq: Three times a day (TID) | ORAL | Status: AC | PRN

## 2013-04-02 NOTE — Progress Notes (Signed)
Patient ID: Heidi Galvan, female   DOB: 08-11-1965, 47 y.o.   MRN: [id number]   Subjective:   Patient ID: Heidi Galvan female   DOB: 09-08-1965 47 y.o.   MRN: [id number]  HPI: Ms.Heidi Galvan is a 47 y.o. spanish speaking F w/ PMHx of Depression, and migraine headaches, presents to the clinic today with complaints of lower back/sacral pain, right flank pain, and neck/right shoulder pain that started about 3 weeks ago. She says the pain has become more severe over the past couple days. She also reports the pain radiating into her groin area, which is sharp in nature. On examination, the patient has tenderness over the right flank, right shoulder, and cervical region. She has decreased ROM 2/2 pain with rotation, extension and flexion of the spine as well as flexion of the neck from side to side. The patient exhibits mild tenderness to deep palpation of the abdomen.  She describes intermittent nausea and vomiting over the same period of time where she will have vomiting that will last for 2 or 3 days at a time and then subsides. During these episodes, she is not able to keep any solids down and does report bilious vomiting at times. She had a cholecystectomy a couple of years ago and reports very similar symptoms prior to her surgery. The patient also claims to have had a fever and chills recently and reports that her daughter checked her temperature yesterday and it was 53 F, however, today in clinic she is afebrile with a temperature of 97.4.  The patient denies any recent abdominal pain, diarrhea, constipation, melena, hematochezia, dysuria, hematuria, cough, or upper respiratory symptoms. No signs of nuchal rigidity or photophobia.  Past Medical History  Diagnosis Date  . Menometrorrhagia   . Migraine   . Depression   . Iron deficiency anemia    Current Outpatient Prescriptions  Medication Sig Dispense Refill  . ferrous sulfate 325 (65 FE) MG tablet Take 1 tablet (325 mg total)  by mouth 3 (three) times daily with meals.  270 tablet  3  . fexofenadine (ALLEGRA) 60 MG tablet Take 1 tablet (60 mg total) by mouth daily.  60 tablet  1  . FLUoxetine (PROZAC) 20 MG capsule Take 1 capsule (20 mg total) by mouth daily.  30 capsule  11  . fluticasone (FLONASE) 50 MCG/ACT nasal spray Place 1 spray into the nose daily.  16 g  3  . sodium chloride (OCEAN NASAL SPRAY) 0.65 % nasal spray Place 1 spray into the nose as needed for congestion.  45 mL  3  . traZODone (DESYREL) 100 MG tablet Take 0.5 tablets (50 mg total) by mouth at bedtime.  30 tablet  6   No current facility-administered medications for this visit.   Family History  Problem Relation Age of Onset  . Asthma Mother   . Hypertension Mother   . Asthma Daughter   . Asthma Son    History   Social History  . Marital Status: Married    Spouse Name: N/A    Number of Children: N/A  . Years of Education: N/A   Social History Main Topics  . Smoking status: Former Smoker    Quit date: 09/12/1981  . Smokeless tobacco: Never Used  . Alcohol Use: No  . Drug Use: No  . Sexual Activity: No   Other Topics Concern  . None   Social History Narrative   Financial assistance approved for 100% discount at [facility] and has [facility] card per  Rudell Cobb   10/102011   Review of Systems: General: Positive for fever/chills at home. Denies diaphoresis, appetite change and fatigue.  HEENT: Positive for neck pain. Denies change in vision, eye pain, redness, hearing loss, congestion, sore throat, rhinorrhea, sneezing, mouth sores, trouble swallowing, neck stiffness and tinnitus.   Respiratory: Denies SOB, DOE, cough, chest tightness, and wheezing.   Cardiovascular: Denies chest pain, palpitations and leg swelling.  Gastrointestinal: Positive for nausea/vomiting. Denies abdominal pain, diarrhea, constipation, blood in stool and abdominal distention.  Genitourinary: Positive for flank pain, radiating to the groin. Denies dysuria, urgency,  frequency, hematuria, and difficulty urinating.  Musculoskeletal: Positive for back pain and knee pain. Denies joint swelling,  and gait problem.  Skin: Denies pallor, rash and wounds.  Neurological: Denies dizziness, seizures, syncope, weakness, lightheadedness, numbness and headaches.  Psychiatric/Behavioral: Denies mood changes, confusion, nervousness, sleep disturbance and agitation.  Objective:   Physical Exam: Filed Vitals:   04/02/13 1323  BP: 131/88  Pulse: 75  Temp: 97.4 F (36.3 C)  TempSrc: Oral  Height: 5' (1.524 m)  Weight: 173 lb 1.6 oz (78.518 kg)  SpO2: 97%   General: Vital signs reviewed.  Patient is a well-developed and well-nourished, in mild distress 2/2 pain. Cooperative with exam. Alert and oriented x3.  Head: Normocephalic and atraumatic. Nose: No erythema or drainage noted.  Turbinates normal. Eyes: PERRL, EOMI, conjunctivae normal, No scleral icterus.  Neck: Positive for cervical tenderness and pain with flexion/extension. Supple, trachea midline, no JVD, masses, thyromegaly, or carotid bruit present.  Cardiovascular: RRR, S1 normal, S2 normal, no murmurs, gallops, or rubs. Pulmonary/Chest: Normal respiratory effort, CTAB, no wheezes, rales, or rhonchi. Abdominal: Soft, mildly tender to deep palpation. Non-distended, bowel sounds are normal, no masses, organomegaly, or guarding present.  GU: Possible CVA tenderness; patient has significant pain over right flank with palpation. Musculoskeletal: Positive for pain over lumbar spine and right flank. Pain with rotation, flexion, and extension of the spine. No joint deformities or erythema. Extremities: No swelling or edema,  pulses symmetric and intact bilaterally. No cyanosis or clubbing. Neurological: A&O x3, Strength is normal and symmetric bilaterally, cranial nerve II-XII are grossly intact, no focal motor deficit, sensory intact to light touch bilaterally.  Skin: Warm, dry and intact. No rashes or  erythema. Psychiatric: Normal mood and affect. speech and behavior is normal. Cognition and memory are normal.   Assessment & Plan:   Please see problem-based assessment and plan.

## 2013-04-02 NOTE — Assessment & Plan Note (Addendum)
Patient describes right flank pain radiating to the groin, back pain, and right shoulder/neck pain for the past week w/ associated intermittent nausea and vomiting and fever and chills at home, however the patient was afebrile in the clinic. The patient denies any symptoms of diarrhea, constipation, abdominal pain, melena, hematochezia, dysuria, hematuria, nuchal rigidity, headache, or photophobia. The patient presents with a wide variety of symptoms, but the main differentials considered involve renal calculi, diverticulitis, UTI, pancreatitis, and pyelonephritis.   -CMET, CBC with diff, and lipase were performed, results all wnl. -UA showed small leukocytes, 3-6 WBC's, trace RBC's (on dipstick), few bacteria, and many squames, suggesting a non-clean-catch urine specimen. However, calcium oxalate crystals were seen. -Urine culture pending. -CT abdomen w/ and w/out contrast was performed and showed the following: 1. Small omental fat containing midline ventral hernia with mild associated stranding which could reflect inflammation or fat  necrosis. This could represent a source of the anterior/midline abdominal pain.  2. Nonobstructing left renal calculus.  3. Nonspecific enlargement/engorgement of the left ovarian vein.  Radiology assessment suggested pelvic congestion syndrome (pelvic pain/aching worse after long periods of standing or activity). -Provided the patient with Tramadol 50 mg q8h prn for pain. -Will discuss further with attending physician and with patient for further workup.

## 2013-04-02 NOTE — Assessment & Plan Note (Signed)
Patient presents to clinic with complaints of nausea and vomiting for the past 3 weeks, intermittently. She says she will have 2-3 days of severe vomiting where she cannot keep any solids down and does claim to have some episodes of bilious vomiting. The worst vomiting was in the past 2 days. She denies any associated abdominal pain but does have mild tenderness on exam. She denies any recent diarrhea, constipation, hematochezia, or melena. In combination with symptoms of flank pain radiating into the groin, back pain, and abdominal tenderness, several differentials were considered including renal calculi, diverticulitis, and pancreatitis. -Sent for lipase, and CMET; all lab values within normal limits.  -CT abdomen w/ and w/out contrast was performed and showed the following: 1. Small omental fat containing midline ventral hernia with mild associated stranding which could reflect inflammation or fat  necrosis. This could represent a source of the anterior/midline abdominal pain.  2. Nonobstructing left renal calculus.  3. Nonspecific enlargement/engorgement of the left ovarian vein.  Radiology assessment suggested pelvic congestion syndrome (pelvic pain/aching worse after long periods of standing or activity). -Provided patient with symptomatic treatment; Zofran 4 mg q8h prn. -Will discuss further with attending physician and with patient for further workup.

## 2013-04-02 NOTE — Progress Notes (Signed)
Patient ID: Heidi Galvan, female   DOB: 1965-10-23, 47 y.o.   MRN: [id number]  I examined Heidi Galvan. She reports non-specific symptoms of low back pain, located right paracentrally over the sacral area (occasionally radiating to groin), along with right sided neck and scapular/shoulder pain. The pain started 3 weeks ago. She also notes intermittent episodes of nausea and vomiting which also started 3 weeks ago, but subsided, however, have been especially bothersome over the past few days and she is not able to eat any solids. She reports intermittent fever and chills as well, however is afebrile in clinic today. She denies abdominal pain, but is mild-moderate tenderness in both lower quadrants of abdomen. Denies diarrhea/constipation. Patient has history of cholecystectomy and a left sided non-obstructing renal stone. Of note, she has had a similar visit on 04/16/11 with Dr. Manson Passey, when a CT w/wo contrast done did not reveal any source of pain. We are unsure about etiology at present and keep a broad differential for abdominal conditions.  I have discussed this case with Dr. Yetta Barre and I personally confirmed the key portions of the history and exam documented by Dr. Yetta Barre and I reviewed pertinent patient test results.  The assessment, diagnosis, and plan were formulated together and I agree with the documentation in the resident's note.

## 2013-04-03 ENCOUNTER — Other Ambulatory Visit: Payer: Self-pay | Admitting: Internal Medicine

## 2013-04-03 ENCOUNTER — Telehealth: Payer: Self-pay | Admitting: Internal Medicine

## 2013-04-03 DIAGNOSIS — R112 Nausea with vomiting, unspecified: Secondary | ICD-10-CM

## 2013-04-03 DIAGNOSIS — R109 Unspecified abdominal pain: Secondary | ICD-10-CM

## 2013-04-03 MED ORDER — CIPROFLOXACIN HCL 500 MG PO TABS: 500.0000 mg | ORAL_TABLET | Freq: Two times a day (BID) | ORAL | Status: AC

## 2013-04-03 MED ORDER — METRONIDAZOLE 500 MG PO TABS: 500.0000 mg | ORAL_TABLET | Freq: Two times a day (BID) | ORAL | Status: AC

## 2013-04-03 NOTE — Progress Notes (Signed)
I have reviewed the labs and the CT scan of her abdomen pelvis done yesterday and think we need to arrange for an semi-urgent surgical referral for this patient to review her CT scan findings and decide on further work up, along with antibiotic treatment to cover possible abdominal infection.

## 2013-04-03 NOTE — Telephone Encounter (Signed)
Spoke with surgeon on call at [facility] regarding Ms. Ceja. I presented tohe case to him as well as the CT findings, wanting to know if the CT report findings of "omental fat containing midline ventral hernia with mild associated stranding which could reflect inflammation or fat necrosis" was an acute/urgent surgical issue. The surgeon said that this is something that could be handled at a later time and most likely is not the reason for her acute findings at this time. He also commented that though the patient does not have insurance, they could still perform the surgery when the time comes for intervention.  Patient will follow up in [facility] early next week and further decisions regarding her care can be made at that time.

## 2013-04-30 NOTE — Addendum Note (Signed)
Addended by: Neomia Dear on: 04/30/2013 05:08 PM   Modules accepted: Orders

## 2013-05-14 ENCOUNTER — Ambulatory Visit: Payer: Self-pay

## 2013-06-02 ENCOUNTER — Ambulatory Visit: Payer: No Typology Code available for payment source | Admitting: Internal Medicine

## 2013-06-02 ENCOUNTER — Ambulatory Visit (INDEPENDENT_AMBULATORY_CARE_PROVIDER_SITE_OTHER): Payer: No Typology Code available for payment source | Admitting: Internal Medicine

## 2013-06-02 ENCOUNTER — Encounter: Payer: Self-pay | Admitting: Internal Medicine

## 2013-06-02 VITALS — BP 126/75 | HR 61 | Temp 97.0°F | Wt 173.1 lb

## 2013-06-02 DIAGNOSIS — Z23 Encounter for immunization: Secondary | ICD-10-CM

## 2013-06-02 DIAGNOSIS — M25569 Pain in unspecified knee: Secondary | ICD-10-CM

## 2013-06-02 DIAGNOSIS — M25561 Pain in right knee: Secondary | ICD-10-CM

## 2013-06-02 MED ORDER — INDOMETHACIN 50 MG PO CAPS: 50.0000 mg | ORAL_CAPSULE | Freq: Two times a day (BID) | ORAL | Status: DC

## 2013-06-02 MED ORDER — MONTELUKAST SODIUM 10 MG PO TABS: 10.0000 mg | ORAL_TABLET | Freq: Every day | ORAL | Status: AC

## 2013-06-02 NOTE — Progress Notes (Signed)
Subjective:     Patient ID: Heidi Galvan, female   DOB: 1965/10/15, 47 y.o.   MRN: [id number]  HPI The patient is a 47 YO female who is coming in today for acute knee pain. She is spanish speaking and interpretor does help with the interview. She does admit to having a fall recently (about 10 days ago). She states that she was walking her dog and the dog pulled her and she fell forward. She is not sure if she twisted when she fell. She did not have immediate swelling after the incident. She did have pain the next day associated with some swelling. She used ice packs which helped and started taking ibuprofen. This helps with the pain but hurts her stomach and she wishes to have an alternative. She states she has had injections in her knees before and she would like to see an orthopedic doctor for evaluation. She is not having fevers or chills at home. She does not admit to the knee giving out on her or twisting. She has PMH of depression, iron deficiency anemia. She is also having some increasing drainage in her nose that is leading her to have tenderness in her head around her eyes. It is tender to the touch and tylenol has not worked.   Review of Systems  Constitutional: Positive for activity change. Negative for fever, chills, diaphoresis, appetite change, fatigue and unexpected weight change.  HENT: Positive for congestion, rhinorrhea and sinus pressure. Negative for dental problem, drooling, ear discharge, ear pain, facial swelling, hearing loss, mouth sores, nosebleeds, postnasal drip, sneezing, sore throat, tinnitus, trouble swallowing and voice change.   Eyes: Negative for photophobia, pain, discharge, redness, itching and visual disturbance.  Respiratory: Negative for cough, chest tightness, shortness of breath and wheezing.   Cardiovascular: Negative for chest pain, palpitations and leg swelling.  Gastrointestinal: Negative for nausea, vomiting and diarrhea.  Musculoskeletal: Positive for  arthralgias.  Neurological: Negative for dizziness, tremors, seizures, syncope, facial asymmetry, speech difficulty, weakness, light-headedness, numbness and headaches.       Objective:   Physical Exam  Constitutional: She is oriented to person, place, and time. She appears well-developed and well-nourished. No distress.  HENT:  Head: Normocephalic and atraumatic.  Right Ear: External ear normal.  Left Ear: External ear normal.  Some redness and bogginess in the nostrils bilaterally and mild erythema in the throat without exudate or purulent drainage. Some tenderness around the frontal and orbital sinuses.   Eyes: EOM are normal. Pupils are equal, round, and reactive to light.  Neck: Normal range of motion. Neck supple. No JVD present. No tracheal deviation present. No thyromegaly present.  Cardiovascular: Normal rate and regular rhythm.   No murmur heard. Pulmonary/Chest: Effort normal and breath sounds normal. No respiratory distress. She has no wheezes. She has no rales.  Abdominal: Soft. Bowel sounds are normal. She exhibits no distension. There is no tenderness. There is no rebound.  Musculoskeletal: Normal range of motion. She exhibits tenderness. She exhibits no edema.  Right knee with tenderness along the medial lateral ligament without pain on varus or valgus strain. ACL and PCL intact without evidence of tear. No tenderness along the patella or tibial bone. No swelling in the joint.   Left knee with tenderness along the medial lateral ligament without pain on varus or valgus strain. ACL and PCL intact without evidence of tear. No tenderness along the patella or tibial bone. No swelling in the joint.    Neurological: She is alert and oriented  to person, place, and time. No cranial nerve deficit.  Skin: Skin is warm and dry. No rash noted. No erythema.       Assessment/Plan:   1. Knee pain - Patient is status post fall and likely represents inflammation although the majority of  the tenderness is along the medial lateral ligament and could represent strain of that ligament. No joint deformity of tendon tear evident on exam. ACL and PCL intact bilaterally. Will trial NSAIDs for pain and  Refer to orthopedics.  2. Disposition - She will start indomethacin 50 mg BID with meals for 1-2 weeks and then if not improved she will return to the clinic. She will start montelukast 10 mg daily for her sinuses in addition to her other medications. Otherwise follow up as previously scheduled.

## 2013-06-02 NOTE — Patient Instructions (Signed)
Vamos a tener que ir al mdico ortopdico para las rodillas. Podemos tratar de medicina para sus rodillas llamados indometacina que se puede tomar [employer] al da con las comidas. Si le duele el estmago deje de tomarlo.  Usted tiene la vacuna contra la gripe en la actualidad.  Vamos a tratar de montelukast (Singulair) para sus alergias y los dolores de [place].  Vuelve con su mdico segn lo programado previamente y llmenos al [phone] con cualquier problema.  We will have you go to the orthopedic doctor for your knees. We can try medicine for your knees called indomethacin which you can take twice a day with meals. If it hurts your stomach stop taking it.   You got the flu shot today.   We will try montelukast (singulair) for your allergies and the headaches.   Come back with your doctor as previously scheduled and call us at [phone] with any problems.

## 2013-06-05 NOTE — Progress Notes (Signed)
Case discussed with Dr. Kollar soon after the resident saw the patient.  We reviewed the resident's history and exam and pertinent patient test results.  I agree with the assessment, diagnosis, and plan of care documented in the resident's note. 

## 2013-06-11 ENCOUNTER — Encounter: Payer: Self-pay | Admitting: Internal Medicine

## 2013-06-11 ENCOUNTER — Ambulatory Visit (INDEPENDENT_AMBULATORY_CARE_PROVIDER_SITE_OTHER): Payer: No Typology Code available for payment source | Admitting: Internal Medicine

## 2013-06-11 VITALS — BP 135/87 | HR 64 | Temp 97.1°F | Wt 171.6 lb

## 2013-06-11 DIAGNOSIS — IMO0002 Reserved for concepts with insufficient information to code with codable children: Secondary | ICD-10-CM

## 2013-06-11 DIAGNOSIS — R7309 Other abnormal glucose: Secondary | ICD-10-CM

## 2013-06-11 DIAGNOSIS — R7302 Impaired glucose tolerance (oral): Secondary | ICD-10-CM

## 2013-06-11 DIAGNOSIS — M1710 Unilateral primary osteoarthritis, unspecified knee: Secondary | ICD-10-CM

## 2013-06-11 DIAGNOSIS — M1712 Unilateral primary osteoarthritis, left knee: Secondary | ICD-10-CM

## 2013-06-11 DIAGNOSIS — M1711 Unilateral primary osteoarthritis, right knee: Secondary | ICD-10-CM

## 2013-06-11 LAB — GLUCOSE, CAPILLARY: Glucose-Capillary: 111 mg/dL — ABNORMAL HIGH (ref 70–99)

## 2013-06-11 NOTE — Progress Notes (Signed)
Knee  with Injection Procedure Note  Pre-operative Diagnosis: right osteoarthritis  Post-operative Diagnosis: same  Indications: Symptom relief from osteoarthritis  Anesthesia: not required    Procedure Details   Verbal consent was obtained for the procedure. The joint was prepped with Betadine and a small wheel of anesthetic was injected into the subcutaneous tissue. A 22 gauge needle was inserted into the superior aspect of the joint from a lateral approach. 1 ml 1% lidocaine and 1 ml of triamcinolone (KENALOG) 40mg /ml was then injected into the joint through the same needle. The needle was removed and the area cleansed and dressed.  Complications:  None; patient tolerated the procedure well.

## 2013-06-11 NOTE — Patient Instructions (Signed)
We will check you for diabetes today and have given you injection in your knees.   Use ice pack tonight for about 20 minutes to help. You may be a little sore tonight. Stretch the legs tonight to help.   Le detectar diabetes hoy y le hemos dado la inyeccin en las rodillas.  Utilice compresas de hielo esta noche durante unos 20 minutos para ayudar. Usted puede ser un poco de dolor de esta noche. Estire las piernas esta noche para Contractor.  Llmenos con sus preguntas o problemas en el [phone].  iInyeccin en la rodilla (Knee Injection) Pueden aplicarse inyecciones en la rodilla. El Field seismologist una aguja en la articulacin. La aguja es necesaria para colocar un medicamento en la articulacin. Se utilizan para tratar diferentes enfermedades que causan dolor en la rodilla, como osteoartritis, bursitis, rebrotes locales de artritis reumatoidea y pseudogota. Los [employer] corticoides y los anestsicos son los medicamentos que con ms frecuencia se Banker las articulaciones y tejidos blandos de modo inyectable.  PROCEDIMIENTO  Debe limpiarse la piel que se encuentra sobre la rtula con una solucin antisptica.  El mdico inyectar una pequea cantidad de anestsico local (medicamento como la [place]) justo por debajo de la piel, en la zona que fue higienizada.  Luego que el rea est adormecida, se coloca una segunda inyeccin. Esta segunda inyeccin generalmente contiene un anestsico y un antiinflamatorio denominado corticoide o cortisona. La aguja se coloca con cuidado entre la rtula y la rodilla, y se Scientist, physiological en el espacio de la articulacin.  Luego de [employer], se retira la aguja. El mdico colocar un vendaje sobre el sitio de la inyeccin. El procedimiento no lleva ms de un par de minutos. ANTES DEL PROCEDIMIENTO Lave toda la piel alrededor de la zona de la rodilla. Trate de retirar la piel suelta. No es necesario ninguna otra  preparacin especfica excepto que el mdico le indique otra cosa. COMENTE CON SU MDICO SI:  Sufre alergias  Medicamentos que toma, incluyendo hierbas, gotas oftlmicas, medicamentos de venta libre y cremas  Uso de esteroides (por va oral o cremas)  Test de embarazo (si corresponde).  Problemas anteriores debido a anestsicos o a Patent examiner.  Antecedentes de haber tenido cogulos sanguneos (tromboflebitis)  Antecedentes de hemorragias o problemas sanguneos  Cirugas previas.  Otros problemas de salud RIESGOS Y COMPLICACIONES Los efectos adversos de la cortisona son raros. Ellos son:   Hematomas leves.  Reduccn del tejido graso normal bajo la piel en la que se aplic la inyeccin.  Aumento del dolor luego de la inyeccin.  Infecciones  Debilidad o ruptura de los tendones.  Reaccin alrgica al medicamento.  Los diabticos tienen un aumento temporario de International aid/development worker en la sangre luego de una inyeccin.  Los corticoides pueden debilitar el sistema inmunolgico por un tiempo. Mientras se reciben estas inyecciones, no debe recibir algunas vacunas. Tambin evite el contacto con toda persona que tenga varicela o paperas. Especialmente si nunca ha sufrido estas enfermedades o no ha sido vacunado previamente. Podra ser que su sistema inmunolgico no sea lo suficientemente fuerte como para Production manager contra una infeccin mientras recibe cortisona. DESPUES DEL PROCEDIMIENTO  Podr volver a su casa despus del procedimiento.  Coloque hielo en la articulacin durante 15 a 20 minutos cada 3  4 horas hasta que el dolor desaparezca.  Podra ser necesario que coloque un vendaje elstico en la articulacin. INSTRUCCIONES PARA EL CUIDADO DOMICILIARIO  Slo tome medicamentos de venta libre o prescriptos  para Primary school teacher, las [place], o bajar la fiebre segn las indicaciones de su mdico.  Evite el estrs sobre la articulacin. Evite las actividades que ejerzan presin sobre la  articulacin de la rodilla como:  Garment/textile technologist en bicicleta  Alpinismo recreativo  Caminatas  Recustese y [employer] la pierna sobre el nivel del corazn para minimizar la hinchazn. SOLICITE ATENCIN MDICA SI:  La hinchazn vuelve luego de haberse ido o empeora.  Observa una secrecin en la zona de la puncin.  Observa una lnea roja que se extiende por arriba o por debajo del sitio en el que le insertaron la aguja. SOLICITE ATENCIN MDICA DE INMEDIATO SI:  Le sube la fiebre.  Siente un dolor que empeora an tomando antibiticos.  La zona est roja y caliente y tiene problemas para Surveyor, minerals. ASEGRESE QUE:   Comprende estas instrucciones.  Controlar su enfermedad.  Solicitar ayuda de inmediato si no mejora o si empeora. Document Released: 10/25/2008 Document Revised: 10/01/2011 [facility] Patient Information 2014 [place], [place].

## 2013-06-11 NOTE — Progress Notes (Signed)
Knee  with Injection Procedure Note  Pre-operative Diagnosis: left osteoarthitis  Post-operative Diagnosis: same  Indications: Symptom relief from osteoarthritis  Anesthesia: not required   Procedure Details   Verbal consent was obtained for the procedure. The joint was prepped with Betadine and site marked. A 22 gauge needle was inserted into the inferior aspect of the joint from a medial approach. 1 ml 1% lidocaine and 1 ml of triamcinolone (KENALOG) 40mg /ml was then injected into the joint through the same needle. The needle was removed and the area cleansed and dressed.  Complications:  None; patient tolerated the procedure well.

## 2013-06-11 NOTE — Progress Notes (Signed)
Subjective:     Patient ID: Heidi Galvan, female   DOB: 1965/08/17, 47 y.o.   MRN: [id number]  HPI  The patient is a 47 YO female who is coming in today for knee pain that was not helped much by NSAIDs. She is status post fall and she has had adverse stomach effects from NSAIDs and was unable to tolerate those. She is spanish speaking and interpretor does help with the interview. She does admit to having a fall recently (about 10 days ago). She states she has had injections in her knees before and she would like to try that today. She is not having fevers or chills at home. She does not admit to the knee giving out on her or twisting. She has PMH of depression, iron deficiency anemia.   Review of Systems  Constitutional: Positive for activity change. Negative for fever, chills, diaphoresis, appetite change, fatigue and unexpected weight change.  HENT: Negative for dental problem, drooling, ear discharge, ear pain, facial swelling, hearing loss, mouth sores, nosebleeds, postnasal drip, sneezing, sore throat, tinnitus, trouble swallowing and voice change.   Eyes: Negative for photophobia, pain, discharge, redness, itching and visual disturbance.  Respiratory: Negative for cough, chest tightness, shortness of breath and wheezing.   Cardiovascular: Negative for chest pain, palpitations and leg swelling.  Gastrointestinal: Negative for nausea, vomiting and diarrhea.  Musculoskeletal: Positive for arthralgias.  Neurological: Negative for dizziness, tremors, seizures, syncope, facial asymmetry, speech difficulty, weakness, light-headedness, numbness and headaches.       Objective:   Physical Exam  Constitutional: She is oriented to person, place, and time. She appears well-developed and well-nourished. No distress.  HENT:  Head: Normocephalic and atraumatic.  Right Ear: External ear normal.  Left Ear: External ear normal.  Eyes: EOM are normal. Pupils are equal, round, and reactive to light.   Neck: Normal range of motion. Neck supple. No JVD present. No tracheal deviation present. No thyromegaly present.  Cardiovascular: Normal rate and regular rhythm.   No murmur heard. Pulmonary/Chest: Effort normal and breath sounds normal. No respiratory distress. She has no wheezes. She has no rales.  Abdominal: Soft. Bowel sounds are normal. She exhibits no distension. There is no tenderness. There is no rebound.  Musculoskeletal: Normal range of motion. She exhibits tenderness. She exhibits no edema.  Right knee with tenderness along the medial lateral ligament without pain on varus or valgus strain. ACL and PCL intact without evidence of tear. No tenderness along the patella or tibial bone. No swelling in the joint.   Left knee with tenderness along the medial lateral ligament without pain on varus or valgus strain. ACL and PCL intact without evidence of tear. No tenderness along the patella or tibial bone. No swelling in the joint.    Neurological: She is alert and oriented to person, place, and time. No cranial nerve deficit.  Skin: Skin is warm and dry. No rash noted. No erythema.       Assessment/Plan:   1. Knee pain - Patient did not get relief with conservative treatment and will do knee injection today. No joint deformity of tendon tear evident on exam. ACL and PCL intact bilaterally.   2. Disposition - Will give injection today for continued knee pain. Otherwise follow up as previously scheduled.

## 2013-06-16 NOTE — Progress Notes (Signed)
Case discussed with Dr. Kollar soon after the resident saw the patient.  We reviewed the resident's history and exam and pertinent patient test results.  I agree with the assessment, diagnosis, and plan of care documented in the resident's note. 

## 2013-07-06 ENCOUNTER — Encounter: Payer: Self-pay | Admitting: Internal Medicine

## 2013-07-06 ENCOUNTER — Ambulatory Visit (INDEPENDENT_AMBULATORY_CARE_PROVIDER_SITE_OTHER): Payer: No Typology Code available for payment source | Admitting: Internal Medicine

## 2013-07-06 VITALS — BP 131/74 | HR 57 | Temp 97.0°F | Ht 61.0 in | Wt 168.6 lb

## 2013-07-06 DIAGNOSIS — M25569 Pain in unspecified knee: Secondary | ICD-10-CM

## 2013-07-06 LAB — BASIC METABOLIC PANEL WITH GFR
CO2: 28 mEq/L (ref 19–32)
Chloride: 105 mEq/L (ref 96–112)
Creat: 0.88 mg/dL (ref 0.50–1.10)
Glucose, Bld: 87 mg/dL (ref 70–99)

## 2013-07-06 MED ORDER — MELOXICAM 15 MG PO TABS: 15.0000 mg | ORAL_TABLET | Freq: Every day | ORAL | Status: DC | PRN

## 2013-07-06 NOTE — Patient Instructions (Addendum)
1. Will contact sports medicine for evaluation.  2. Mobic 15 mg po daily x 7 days as needed.   List of NSAIDS (Non-steroidal anti-inflammatory medications) AVOID THESE MEDICATIONS  Aspirin (Anacin, Ascriptin, Bayer, Bufferin, Ecotrin, Excedrin) Choline and magnesium salicylates (CMT, Tricosal, Trilisate) Choline salicylate (Arthropan) Celecoxib (Celebrex) Diclofenac potassium (Cataflam) Diclofenac sodium (Voltaren, Voltaren XR) Diclofenac sodium with misoprostol (Arthrotec) Diflunisal (Dolobid) Etodolac (Lodine, Lodine XL) Fenoprofen calcium (Nalfon) Flurbiprofen (Ansaid) Ibuprofen (Advil, Motrin, Motrin IB, Nuprin) Indomethacin (Indocin, Indocin SR) Ketoprofen (Actron, Orudis, Orudis KT, Oruvail) Magnesium salicylate (Arthritab, Bayer Select, Doan's Pills, Magan, Mobidin, Mobogesic) Meclofenamate sodium (Meclomen) Mefenamic acid (Ponstel) Meloxicam (Mobic) Nabumetone (Relafen) Naproxen (Naprosyn, Naprelan*) Naproxen sodium (Aleve, Anaprox) Oxaprozin (Daypro) Piroxicam (Feldene) Rofecoxib (Vioxx) Salsalate (Amigesic, Anaflex 750, Disalcid, Marthritic, Mono-Gesic, Salflex, Salsitab) Sodium salicylate (various generics) Sulindac (Clinoril) Tolmetin sodium (Tolectin) Valdecoxib (Bextra)   Knee Pain Knee pain can be a result of an injury or other medical conditions. Treatment will depend on the cause of your pain. HOME CARE  Only take medicine as told by your doctor.  Keep a healthy weight. Being overweight can make the knee hurt more.  Stretch before exercising or playing sports.  If there is constant knee pain, change the way you exercise. Ask your doctor for advice.  Make sure shoes fit well. Choose the right shoe for the sport or activity.  Protect your knees. Wear kneepads if needed.  Rest when you are tired. GET HELP RIGHT AWAY IF:   Your knee pain does not stop.  Your knee pain does not get better.  Your knee joint feels hot to the touch.  You have a  fever. MAKE SURE YOU:   Understand these instructions.  Will watch this condition.  Will get help right away if you are not doing well or get worse. Document Released: 10/05/2008 Document Revised: 10/01/2011 Document Reviewed: 10/05/2008 [facility] Patient Information 2014 [place], [place].

## 2013-07-07 NOTE — Progress Notes (Signed)
Patient: Heidi Galvan   MRN: [id number]  DOB: 03/14/1966  PCP: Courtney Paris, MD   Subjective:    CC: Knee Pain   HPI: Ms. Heidi Galvan is a 47 y.o. female with a PMHx as outlined below, who presented to clinic today for the followup on knee pain. She is spanish speaking and interpretor is present during the interview   1) Bilateral knee pain, right worse than left     Patient states that she had a mechanical fall over one month ago. She was walking her dog and the dog pulled her and she fell forward. She states that she may hurt her knees and noticed B/L knee pain and swelling the following day. She was evaluated the following week on 06/02/13 when she was given indocin PRN and instructions on ice/heat. Her right knee pain and swelling were not relieved. And she was evaluated on 06/11/13 when she was given triamcinolone (KENALOG) injections to both knees. She reports that left knee is better but right knee pain and swelling is persistent. She also reports right knee locking and catching feeling. She has been taking ibuprofen 800 mg daily as needed for a couple weeks.  She is here for evaluation.    Review of Systems: Per HPI.   Current Outpatient Medications: Current Outpatient Prescriptions  Medication Sig Dispense Refill  . ferrous sulfate 325 (65 FE) MG tablet Take 1 tablet (325 mg total) by mouth 3 (three) times daily with meals.  270 tablet  3  . FLUoxetine (PROZAC) 20 MG capsule Take 1 capsule (20 mg total) by mouth daily.  30 capsule  11  . fluticasone (FLONASE) 50 MCG/ACT nasal spray Place 1 spray into the nose daily.  16 g  3  . montelukast (SINGULAIR) 10 MG tablet Take 1 tablet (10 mg total) by mouth daily.  30 tablet  2  . ondansetron (ZOFRAN) 4 MG tablet Take 1 tablet (4 mg total) by mouth every 8 (eight) hours as needed for nausea.  30 tablet  1  . traMADol (ULTRAM) 50 MG tablet Take 1 tablet (50 mg total) by mouth every 8 (eight) hours as needed for pain.  20  tablet  0  . meloxicam (MOBIC) 15 MG tablet Take 1 tablet (15 mg total) by mouth daily as needed for pain. Do not take other NSAIDs such as ibuprofen, motrin, naproxen while on this medication. Do not take more than prescribed. FOR PAIN  7 tablet  0  . sodium chloride (OCEAN NASAL SPRAY) 0.65 % nasal spray Place 1 spray into the nose as needed for congestion.  45 mL  3  . traZODone (DESYREL) 100 MG tablet Take 0.5 tablets (50 mg total) by mouth at bedtime.  30 tablet  6   No current facility-administered medications for this visit.    Allergies: Allergies  Allergen Reactions  . Cholestatin   . Penicillins Rash    Very mild rash on hand and was more than 20 years ago    Past Medical History  Diagnosis Date  . Menometrorrhagia   . Migraine   . Depression   . Iron deficiency anemia     Objective:    Physical Exam: Filed Vitals:   07/06/13 1022  BP: 131/74  Pulse: 57  Temp: 97 F (36.1 C)  TempSrc: Oral  Height: 5\' 1"  (1.549 m)  Weight: 168 lb 9.6 oz (76.476 kg)  SpO2: 99%     General: Vital signs reviewed and noted. Well-developed, well-nourished, in  no acute distress; alert, appropriate and cooperative throughout examination.  Head: Normocephalic, atraumatic.  Lungs:  Normal respiratory effort. Clear to auscultation BL without crackles or wheezes.  Heart: RRR. S1 and S2 normal without gallop, rubs, murmur.  Abdomen:  BS normoactive. Soft, Nondistended, non-tender.  No masses or organomegaly.  Extremities: No pretibial edema.  Knees.                 Right knee: Diffused swelling noted.  Tenderness along the medial joint line especially medial to patellar tendon.  No bruising, erythema or warmth to touch.  No crepitus. Limited range of motion especially to worse and the flexion and extension.  Strength and sensation normal. good peripheral pulse.  Equivocal valgus test due to pain.  Negative varus test.  Negative drawers test.  Positive McMurray's test.                                Left knee.  No erythema, swelling, tenderness or warmth to touch noted.  No crepitus or limited range of motion.  Negative varus/valgus, drawers and provocative tests.  Normal strength and sensation and good peripheral pulse Assessment/ Plan:

## 2013-07-07 NOTE — Assessment & Plan Note (Addendum)
Assessment Patient presents with 51-month duration of right knee pain despite conservative treatment.  The clinical manifestation is suggestive of possible meniscus injury/tear.  Plan - Will check BMP for renal function given her intermittent NSAID use. -Will prescribe Mobic 15 mg by mouth daily as needed for 7 days if renal function is okay. - Orthopedic referral was sent but she is unable to get appointment this month.  Will send referral for sports medicine for further evaluation and management.

## 2013-07-08 NOTE — Progress Notes (Signed)
Case discussed with Dr. Li at the time of the visit.  We reviewed the resident's history and exam and pertinent patient test results.  I agree with the assessment, diagnosis, and plan of care documented in the resident's note. 

## 2013-08-04 ENCOUNTER — Ambulatory Visit (INDEPENDENT_AMBULATORY_CARE_PROVIDER_SITE_OTHER): Payer: No Typology Code available for payment source | Admitting: Family Medicine

## 2013-08-04 ENCOUNTER — Ambulatory Visit
Admission: RE | Admit: 2013-08-04 | Discharge: 2013-08-04 | Disposition: A | Discharge status: Home / Self Care | Payer: No Typology Code available for payment source | Source: Ambulatory Visit | Attending: Family Medicine | Admitting: Family Medicine

## 2013-08-04 ENCOUNTER — Encounter: Payer: Self-pay | Admitting: Family Medicine

## 2013-08-04 VITALS — BP 125/83 | Ht 61.0 in | Wt 168.0 lb

## 2013-08-04 DIAGNOSIS — M25569 Pain in unspecified knee: Secondary | ICD-10-CM

## 2013-08-04 DIAGNOSIS — M25561 Pain in right knee: Secondary | ICD-10-CM

## 2013-08-04 DIAGNOSIS — M25562 Pain in left knee: Secondary | ICD-10-CM

## 2013-08-04 MED ORDER — METHYLPREDNISOLONE ACETATE 40 MG/ML IJ SUSP
40.0000 mg | Freq: Once | INTRAMUSCULAR | Status: AC
  Administered 2013-08-04: 40 mg via INTRA_ARTICULAR

## 2013-08-04 MED ORDER — MELOXICAM 15 MG PO TABS: 15.0000 mg | ORAL_TABLET | Freq: Every day | ORAL | Status: DC

## 2013-08-04 NOTE — Progress Notes (Signed)
CC: Bilateral knee pain, right greater than left HPI: Patient is a pleasant 48 year old female who presents with bilateral knee pain, right greater than left. Patient states that she fell down several months ago with a hyperflexion type of injury. She had very severe knee pain after that time. Her primary care doctor performed a bilateral steroid knee injections on November 20. She states that her left knee improve substantially with this injection however her right knee continues to be severely painful. She does think that the right knee injection helped her a little bit. She has pain on an everyday basis that is worse at night. She states that she had a similar type of pain 2 years ago that resolved after injection therapy. She has a crunching-type sensation in her knee as well as swelling. She has tried 3 different oral NSAIDs without pain relief. She did not tolerate tramadol due to vomiting.  ROS: As above in the HPI. All other systems are stable or negative.  OBJECTIVE: APPEARANCE:  Patient in no acute distress.The patient appeared well nourished and normally developed. HEENT: No scleral icterus. Conjunctiva non-injected Resp: Non labored Skin: No rash MSK:  Right Knee - Inspection normal with no erythema or effusion or obvious bony abnormalities.  - Palpation normal with no warmth or joint line tenderness. No TTP at patellar or quad tendon.  - ROM substantially decrease. She has a loss of the terminal 10-15 of extension and flexion limited to 90 due to pain - Strength 5/5 in flexion and extension. - Ligaments with solid consistent endpoints including ACL, PCL, LCL, MCL.  - Severe pain with Mcmurray's.  - Neurovascularly intact  MSK [place]: Not performed   ASSESSMENT: #1. Severe right knee pain, suspect degenerative meniscal tear versus osteoarthritis   PLAN: We will obtain standing x-rays of the patient's knees so that we can valuate the extent of degenerative arthritis. After  discussion of risks and benefits, patient would like to go forward with repeat corticosteroid injection into her right knee. I did explain to her that based on the fact that she is not quite 2 months out from previous injection that she does have a slightly higher risk of joint infection with repeat injection. However patient has not responded well to other alternative therapies and is in severe pain so I think the  benefit outweighs the risk. She was also given some home exercises to work on for quad strengthening We will see her back in one month for recheck.  Consent obtained and verified.  Time-out conducted.  Noted no overlying erythema, induration, or other signs of local infection.  Skin prepped in a sterile fashion.  Topical analgesic spray: Ethyl chloride.  Joint: Right knee Needle: 22-gauge 1-1/2 inch Completed without difficulty.  Meds: 6 cc 1% lidocaine, 2 cc depomedrol Advised to call if fevers/chills, erythema, induration, drainage, or persistent bleeding.

## 2013-08-04 NOTE — Patient Instructions (Signed)
Thank you for coming in today  Do exercises at home Right knee injection today Followup 1 month Get xrays

## 2013-09-02 ENCOUNTER — Ambulatory Visit (INDEPENDENT_AMBULATORY_CARE_PROVIDER_SITE_OTHER): Payer: No Typology Code available for payment source | Admitting: Family Medicine

## 2013-09-02 ENCOUNTER — Encounter: Payer: Self-pay | Admitting: Family Medicine

## 2013-09-02 VITALS — BP 130/87 | HR 69 | Ht 61.0 in | Wt 168.0 lb

## 2013-09-02 DIAGNOSIS — M25562 Pain in left knee: Principal | ICD-10-CM

## 2013-09-02 DIAGNOSIS — M25569 Pain in unspecified knee: Secondary | ICD-10-CM

## 2013-09-02 DIAGNOSIS — M25561 Pain in right knee: Secondary | ICD-10-CM

## 2013-09-02 MED ORDER — TRAMADOL HCL 50 MG PO TABS: 50.0000 mg | ORAL_TABLET | Freq: Four times a day (QID) | ORAL | Status: DC | PRN

## 2013-09-02 NOTE — Patient Instructions (Signed)
Thank you for coming in today  1. Refer to physical therapy 2. Try tramadol. Take 1/2 tablet in the morning and 1/2 tablet at night. You may increase this to 1 tablet morning and night if you still have pain 3. Try knee compression sleeve from [employer] or pharmacy

## 2013-09-02 NOTE — Progress Notes (Signed)
CC: Followup right knee pain HPI: Patient returns for followup today. She had severe right knee pain after a injury back in the fall. She has not tolerated NSAID therapy due to GI effects and tramadol had made her nauseous. Based on that we elected to repeat a corticosteroid injection early. She returns today saying she is at least 75% better. She does still get a pinching sensation in her medial right knee. She also notes that she is having some discomfort in her left knee now. Stairs are particularly difficult.  ROS: As above in the HPI. All other systems are stable or negative.  OBJECTIVE: APPEARANCE:  Patient in no acute distress.The patient appeared well nourished and normally developed. HEENT: No scleral icterus. Conjunctiva non-injected Resp: Non labored Skin: No rash MSK:  Left Knee - Inspection normal with no erythema or effusion or obvious bony abnormalities.  - Palpation normal with no warmth or joint line tenderness. No TTP at patellar or quad tendon.  - ROM normal in flexion and extension. She does have pain with deep flexion - Strength 5/5 in flexion and extension. - Ligaments with solid consistent endpoints including ACL, PCL, LCL, MCL.  - Negative Mcmurray's.  - Non painful patellar compression.  - Neurovascularly intact Right knee: - No swelling or effusion - No significant joint line tenderness - Full range of motion   MSK [place]: Not performed  Radiographs: 4 view x-rays of the bilateral knees that I had previously ordered on her last visit are now reviewed and interpreted today. There is moderate medial joint space narrowing of the right knee. Left knee has mild medial joint space narrowing.   ASSESSMENT: #1. Improving right knee pain, suspect osteoarthritis versus degenerative meniscus tear #2. Mild left knee pain likely due to gait change compensating for right knee  PLAN: I'm glad that she has had considerable improvement at this time. We will get her into  physical therapy. I do not think an MRI would be beneficial at this time since we would not pursue surgical intervention. She was encouraged to try taking one half tramadol twice a day to see if this makes her more comfortable. If she tolerates this okay but still has pain she may increase to one full tramadol twice a day. I've also advised her to obtain knee compression sleeves to wear while she is on her feet. Followup in 6 weeks.

## 2013-09-10 ENCOUNTER — Ambulatory Visit: Payer: No Typology Code available for payment source | Attending: Family Medicine | Admitting: Physical Therapy

## 2013-09-10 DIAGNOSIS — R5381 Other malaise: Secondary | ICD-10-CM | POA: Insufficient documentation

## 2013-09-10 DIAGNOSIS — IMO0001 Reserved for inherently not codable concepts without codable children: Secondary | ICD-10-CM | POA: Insufficient documentation

## 2013-09-10 DIAGNOSIS — M25669 Stiffness of unspecified knee, not elsewhere classified: Secondary | ICD-10-CM | POA: Insufficient documentation

## 2013-09-10 DIAGNOSIS — M25569 Pain in unspecified knee: Secondary | ICD-10-CM | POA: Insufficient documentation

## 2013-09-10 DIAGNOSIS — F329 Major depressive disorder, single episode, unspecified: Secondary | ICD-10-CM | POA: Insufficient documentation

## 2013-09-10 DIAGNOSIS — F3289 Other specified depressive episodes: Secondary | ICD-10-CM | POA: Insufficient documentation

## 2013-09-16 ENCOUNTER — Ambulatory Visit: Payer: No Typology Code available for payment source | Admitting: Physical Therapy

## 2013-09-18 ENCOUNTER — Ambulatory Visit: Payer: No Typology Code available for payment source | Admitting: Physical Therapy

## 2013-09-23 ENCOUNTER — Ambulatory Visit: Payer: No Typology Code available for payment source | Attending: Family Medicine | Admitting: Physical Therapy

## 2013-09-23 DIAGNOSIS — IMO0001 Reserved for inherently not codable concepts without codable children: Secondary | ICD-10-CM | POA: Insufficient documentation

## 2013-09-23 DIAGNOSIS — F3289 Other specified depressive episodes: Secondary | ICD-10-CM | POA: Insufficient documentation

## 2013-09-23 DIAGNOSIS — M25669 Stiffness of unspecified knee, not elsewhere classified: Secondary | ICD-10-CM | POA: Insufficient documentation

## 2013-09-23 DIAGNOSIS — M25569 Pain in unspecified knee: Secondary | ICD-10-CM | POA: Insufficient documentation

## 2013-09-23 DIAGNOSIS — F329 Major depressive disorder, single episode, unspecified: Secondary | ICD-10-CM | POA: Insufficient documentation

## 2013-09-23 DIAGNOSIS — R5381 Other malaise: Secondary | ICD-10-CM | POA: Insufficient documentation

## 2013-09-25 ENCOUNTER — Ambulatory Visit: Payer: No Typology Code available for payment source | Admitting: Physical Therapy

## 2013-09-30 ENCOUNTER — Ambulatory Visit: Payer: No Typology Code available for payment source | Admitting: Physical Therapy

## 2013-10-02 ENCOUNTER — Ambulatory Visit: Payer: No Typology Code available for payment source | Admitting: Physical Therapy

## 2013-10-07 ENCOUNTER — Ambulatory Visit: Payer: No Typology Code available for payment source

## 2013-10-09 ENCOUNTER — Ambulatory Visit: Payer: No Typology Code available for payment source | Admitting: Physical Therapy

## 2013-10-13 ENCOUNTER — Ambulatory Visit (INDEPENDENT_AMBULATORY_CARE_PROVIDER_SITE_OTHER): Payer: No Typology Code available for payment source | Admitting: Family Medicine

## 2013-10-13 ENCOUNTER — Encounter: Payer: Self-pay | Admitting: Family Medicine

## 2013-10-13 VITALS — BP 139/92 | HR 71 | Ht 61.0 in | Wt 168.0 lb

## 2013-10-13 DIAGNOSIS — M25561 Pain in right knee: Secondary | ICD-10-CM

## 2013-10-13 DIAGNOSIS — M25569 Pain in unspecified knee: Secondary | ICD-10-CM

## 2013-10-13 NOTE — Patient Instructions (Signed)
Thank you for coming in today  1. Finish physical therapy 2. Continue exercises at home 3. Try swimming, biking, or elliptical for exercise 4. Try topical creams like Capsaicin cream, Aspercreme, or Biofreeze.  Followup as needed

## 2013-10-13 NOTE — Progress Notes (Signed)
CC: Followup knee pain HPI: Patient returns for followup of bilateral knee pain. She has thus far received corticosteroid injections, medications, and physical therapy. She is now in physical therapy and notes her knees are feeling much better. She continues to make progress. She has 4 more appointments scheduled at this time. She has been doing her home exercise plan. She has not really needed the tramadol very often and is only taking it when her pain is more severe. She does not have any swelling.  ROS: As above in the HPI. All other systems are stable or negative.  OBJECTIVE: APPEARANCE:  Patient in no acute distress.The patient appeared well nourished and normally developed. HEENT: No scleral icterus. Conjunctiva non-injected Resp: Non labored Skin: No rash MSK:  Bilateral knees: No swelling or deformity. No effusion. Full range of motion. No joint line tenderness.  MSK [place]: Not performed   ASSESSMENT: #1. Improving right knee pain, suspect osteoarthritis versus degenerative meniscus tear  #2. Mild left knee pain likely due to gait change compensating for right knee, improving with physical therapy  PLAN: Continue physical therapy until released. Encouraged her to continue the home exercise program. Continue to work on weight loss. Low-impact exercise such as swimming, biking, elliptical. Tramadol as needed. Followup as needed. Try topical arthritis creams such as Aspercreme or capsaicin cream on focal spot of tenderness over medial joint line of right knee.

## 2013-10-14 ENCOUNTER — Ambulatory Visit: Payer: No Typology Code available for payment source | Admitting: Physical Therapy

## 2013-10-16 ENCOUNTER — Ambulatory Visit: Payer: No Typology Code available for payment source | Admitting: Physical Therapy

## 2013-10-19 ENCOUNTER — Ambulatory Visit (INDEPENDENT_AMBULATORY_CARE_PROVIDER_SITE_OTHER): Payer: No Typology Code available for payment source | Admitting: Internal Medicine

## 2013-10-19 ENCOUNTER — Encounter: Payer: Self-pay | Admitting: Internal Medicine

## 2013-10-19 VITALS — BP 136/80 | HR 92 | Temp 98.8°F | Ht 61.0 in | Wt 177.1 lb

## 2013-10-19 DIAGNOSIS — J069 Acute upper respiratory infection, unspecified: Secondary | ICD-10-CM | POA: Insufficient documentation

## 2013-10-19 DIAGNOSIS — B9789 Other viral agents as the cause of diseases classified elsewhere: Principal | ICD-10-CM

## 2013-10-19 MED ORDER — BENZONATATE 100 MG PO CAPS: 100.0000 mg | ORAL_CAPSULE | Freq: Four times a day (QID) | ORAL | Status: AC | PRN

## 2013-10-19 MED ORDER — ALBUTEROL SULFATE HFA 108 (90 BASE) MCG/ACT IN AERS: 2.0000 | INHALATION_SPRAY | Freq: Four times a day (QID) | RESPIRATORY_TRACT | Status: DC | PRN

## 2013-10-19 MED ORDER — GUAIFENESIN ER 600 MG PO TB12: 600.0000 mg | ORAL_TABLET | Freq: Two times a day (BID) | ORAL | Status: AC

## 2013-10-19 NOTE — Progress Notes (Signed)
[facility] Subjective:   Patient ID: Heidi Galvan female   DOB: [birth year] 48 y.o.   MRN: [id number]  HPI: Ms.Abra Galvan is a 48 y.o. [place] speaking female.  An interpreter is used for this visit. She presents today for an acute visit.  She reports her CC is that she is "sick."  She notes that for the last 3 days. She has had a sore throat, body aches, chills, nausea and vomiting (only the last 2 days) 3 times yesterday and 2 times today.  She does note a tinge of blood in her vomit. She reports she has felt progressively worse.  He also reports sinus pressure in her face, teeth and ears (R>L).  She reports her pain is a 7/10. She has tried throat losanges, tylenol, warm tea and fluids.  She report the tea has helped her cough.  She reports these things provide temporary relief. She did receive the flu vaccine this year.  She reports 1 sick contacts at home- her son (aage 54) who has the same symptoms and started 1 week ago.  He was seen in the ED and then by his PCP who reassured it was a virus and given Rx for Azithromycin.  She reports her son is a little better but still sick and she plans to take him the the doctor again tomorrow.  Past Medical History  Diagnosis Date  . Menometrorrhagia   . Migraine   . Depression   . Iron deficiency anemia    Current Outpatient Prescriptions  Medication Sig Dispense Refill  . albuterol (PROVENTIL HFA;VENTOLIN HFA) 108 (90 BASE) MCG/ACT inhaler Inhale 2 puffs into the lungs every 6 (six) hours as needed for wheezing or shortness of breath.  1 Inhaler  0  . benzonatate (TESSALON PERLES) 100 MG capsule Take 1 capsule (100 mg total) by mouth every 6 (six) hours as needed for cough.  30 capsule  1  . ferrous sulfate 325 (65 FE) MG tablet Take 1 tablet (325 mg total) by mouth 3 (three) times daily with meals.  270 tablet  3  . FLUoxetine (PROZAC) 20 MG capsule Take 1 capsule (20 mg total) by mouth daily.  30 capsule  11   . fluticasone (FLONASE) 50 MCG/ACT nasal spray Place 1 spray into the nose daily.  16 g  3  . guaiFENesin (MUCINEX) 600 MG 12 hr tablet Take 1 tablet (600 mg total) by mouth 2 (two) times daily.  60 tablet  1  . meloxicam (MOBIC) 15 MG tablet Take 1 tablet (15 mg total) by mouth daily as needed for pain. Do not take other NSAIDs such as ibuprofen, motrin, naproxen while on this medication. Do not take more than prescribed. FOR PAIN  7 tablet  0  . meloxicam (MOBIC) 15 MG tablet Take 1 tablet (15 mg total) by mouth daily.  30 tablet  0  . montelukast (SINGULAIR) 10 MG tablet Take 1 tablet (10 mg total) by mouth daily.  30 tablet  2  . ondansetron (ZOFRAN) 4 MG tablet Take 1 tablet (4 mg total) by mouth every 8 (eight) hours as needed for nausea.  30 tablet  1  . sodium chloride (OCEAN NASAL SPRAY) 0.65 % nasal spray Place 1 spray into the nose as needed for congestion.  45 mL  3  . traMADol (ULTRAM) 50 MG tablet Take 1 tablet (50 mg total) by mouth every 8 (eight) hours as needed for pain.  20 tablet  0  .  traMADol (ULTRAM) 50 MG tablet Take 1 tablet (50 mg total) by mouth every 6 (six) hours as needed.  60 tablet  2  . traZODone (DESYREL) 100 MG tablet Take 0.5 tablets (50 mg total) by mouth at bedtime.  30 tablet  6   No current facility-administered medications for this visit.   Family History  Problem Relation Age of Onset  . Asthma Mother   . Hypertension Mother   . Asthma Daughter   . Asthma Son    History   Social History  . Marital Status: Married    Spouse Name: N/A    Number of Children: N/A  . Years of Education: N/A   Social History Main Topics  . Smoking status: Former Smoker    Quit date: 09/12/1981  . Smokeless tobacco: Never Used  . Alcohol Use: No  . Drug Use: No  . Sexual Activity: No   Other Topics Concern  . None   Social History Narrative   Financial assistance approved for 100% discount at [facility] and has [facility] card per [employer]   10/102011   Review  of Systems: Review of Systems  Constitutional: Positive for chills and malaise/fatigue. Negative for fever.  HENT: Positive for congestion, ear pain and sore throat.   Eyes: Negative for blurred vision and pain.  Respiratory: Positive for cough. Negative for sputum production and wheezing.   Cardiovascular: Negative for chest pain and leg swelling.  Gastrointestinal: Negative for abdominal pain.  Genitourinary: Negative for dysuria.  Musculoskeletal: Positive for myalgias.  Neurological: Positive for weakness. Negative for headaches.    Objective:  Physical Exam: Filed Vitals:   10/19/13 1347  BP: 136/80  Pulse: 92  Temp: 98.8 F (37.1 C)  TempSrc: Oral  Height: 5\' 1"  (1.549 m)  Weight: 177 lb 1.6 oz (80.332 kg)  SpO2: 99%   Physical Exam  Nursing note and vitals reviewed. Constitutional: She is well-developed, well-nourished, and in no distress.  HENT:  Right Ear: Hearing, tympanic membrane, external ear and ear canal normal.  Left Ear: Hearing, tympanic membrane, external ear and ear canal normal.  Mouth/Throat: No oropharyngeal exudate, posterior oropharyngeal edema or posterior oropharyngeal erythema.  rhinnorhea  Cardiovascular: Normal rate and regular rhythm.   Pulmonary/Chest: Effort normal and breath sounds normal. She has no wheezes.  Abdominal: Soft. Bowel sounds are normal.  Lymphadenopathy:    She has no cervical adenopathy.    Assessment & Plan:  Case discussed with Dr. Lynnae January See Problem Based Assessment and Plan Medications Ordered Meds ordered this encounter  Medications  . benzonatate (TESSALON PERLES) 100 MG capsule    Sig: Take 1 capsule (100 mg total) by mouth every 6 (six) hours as needed for cough.    Dispense:  30 capsule    Refill:  1  . guaiFENesin (MUCINEX) 600 MG 12 hr tablet    Sig: Take 1 tablet (600 mg total) by mouth 2 (two) times daily.    Dispense:  60 tablet    Refill:  1  . albuterol (PROVENTIL HFA;VENTOLIN HFA) 108 (90 BASE)  MCG/ACT inhaler    Sig: Inhale 2 puffs into the lungs every 6 (six) hours as needed for wheezing or shortness of breath.    Dispense:  1 Inhaler    Refill:  0   Other Orders No orders of the defined types were placed in this encounter.

## 2013-10-19 NOTE — Assessment & Plan Note (Addendum)
Doubt flu given declining numbers in community but possible given her symptoms, she did receive flu vaccine and is out of therapeutic window for tamiflu. - Encourage hydration and fluids - Symptomatic Rx with Tessalon, Mucinex, Albuterol inhaler. - Patient to RTC in not feeling better in 7-10 days but informed it may take up to 6 weeks to feel back to normal. - Encouraged frequent hand washing

## 2013-10-19 NOTE — Patient Instructions (Signed)
Please take Tessalon and Mucinex as directed.  Please bring your medicines with you each time you come.   Medicines may be  Eye drops  Herbal   Vitamins  Pills  Seeing these help [place] take care of you. Infeccin de las vas areas superiores en los adultos (Upper Respiratory Infection, Adult)  La infeccin de las vas areas superiores tambin se conoce como resfro comn. La causa es un tipo de germen (virus). Los resfros se diseminan fcilmente (son contagiosos). Puede transmitirlo a los dems al besar, toser, estornudar o beber en el mismo vaso. Generalmente se cura en 1 a 2 semanas.  [place] medicacin segn las indicaciones.  Use un humidificador caliente o respire el vapor en una ducha caliente.  Beba gran cantidad de lquido para mantener la orina de tono claro o color amarillo plido.  Debe hacer reposo.  Regrese a su trabajo cuando la temperatura se haya normalizado, o cuando el mdico se lo indique. Puede usar un barbijo y Insurance risk surveyor las manos para evitar que el resfro se contagie. SOLICITE AYUDA DE INMEDIATO SI:   Luego de los primeros das siente que empeora en vez de [place].  Tiene dudas relacionadas con los medicamentos.  Siente escalofros, le falta el aire o escupe moco de color marrn o rojo.  Tiene una secrecin nasal de color amarillo o marrn, o siente dolor en el rostro, especialmente cuando se inclina hacia adelante.  Tiene fiebre, siente el cuello hinchado, tiene dolor al tragar u observa manchas blancas en el fondo de la garganta.  Comienza a sentir Clinical cytogeneticist de cabeza intenso o persistente, dolor de odos, en el seno nasal o en el pecho.  Al respirar emite un sonido similar a un silbido (sibilancias).  Siente falta de aire o escupe sangre al toser.  Tiene dolores musculares o rigidez en el cuello. ASEGRESE DE QUE:   Comprende estas instrucciones.  Controlar su enfermedad.  Solicitar ayuda de inmediato si no mejora o  si empeora. Document Released: 12/11/2010 Document Revised: 10/01/2011 [facility] Patient Information 2014 [place], [place].  Benzonatate capsules Qu es este medicamento? [place] se [place] para tratar la tos. Este medicamento puede ser utilizado para otros usos; si tiene alguna pregunta consulte con su proveedor de atencin mdica o con su farmacutico. MARCAS COMERCIALES DISPONIBLES: Best boy, Zonatuss  Qu le debo informar a mi profesional de la salud antes de tomar este medicamento? Necesita saber si usted presenta alguno de los [employer] o situaciones: -enfermedad renal o heptica -una reaccin alrgica o inusual al benzonatato, a los anestsicos, a otros medicamentos, alimentos, colorantes o conservantes -si est embarazada o buscando quedar embarazada -si est amamantando a un beb Cmo debo utilizar este medicamento? Tome este medicamento por va oral con un vaso de agua. Siga las instrucciones de la etiqueta del [place]. Evite romper, Engineer, manufacturing systems o chupar las cpsulas ya que esto podra causar efectos secundarios graves. Tome sus dosis a intervalos regulares. No tome su medicamento con una frecuencia mayor que la indicada. Hable con su pediatra para informarse acerca del uso de este medicamento en nios. Aunque este medicamento ha sido recetado a nios tan menores como de 10 aos de edad para condiciones selectivas, las precauciones se aplican. Sobredosis: Pngase en contacto inmediatamente con un centro toxicolgico o una sala de urgencia si usted cree que haya tomado demasiado medicamento. ATENCIN: [employer] es solo para usted. No comparta este medicamento con nadie. Qu sucede si me olvido de [employer]  dosis? Si olvida una dosis, tmela lo antes posible. Si es casi la hora de la prxima dosis, tome slo esa dosis. No tome dosis adicionales o dobles. Qu puede interactuar con este medicamento? No tome esta medicina con ninguno de los siguientes  medicamentos: - IMAOs, tales como [place], Eldepryl, [place], Nardil y Parnate Puede ser que esta lista no menciona todas las posibles interacciones. Informe a su profesional de KB Home	[place] de [employer] Corporation productos a base de hierbas, medicamentos de [place] o suplementos nutritivos que est tomando. Si usted fuma, consume bebidas alcohlicas o si utiliza drogas ilegales, indqueselo tambin a su profesional de KB Home	[place]. Algunas sustancias pueden interactuar con su medicamento. A qu debo estar atento al usar Coca[employer]a? Si los sntomas no mejoran o si empeoran, consulte con su mdico. Si tiene fiebre alta, erupcin cutnea o dolor de [place], consulte con su profesional de la salud. Puede experimentar mareos o somnolencia. No conduzca ni utilice maquinaria ni haga nada que Associate Professor en estado de alerta hasta que sepa cmo le afecta este medicamento. No se siente ni se ponga de pie con rapidez, especialmente si es un paciente de edad avanzada. Esto reduce el riesgo de mareos o [employer]. Qu efectos secundarios puedo tener al [employer] este medicamento? Efectos secundarios que debe informar a su mdico o a Barrister's clerk de la salud tan pronto como sea posible: -Chief of Staff como erupcin cutnea o urticarias, hinchazn de la cara, labios o lengua -problemas respiratorios -dolor en el pecho -confusin o alucinaciones -pulso cardiaco irregular -entumecimiento de la boca o la garganta -convulsiones Efectos secundarios que, por lo general, no requieren atencin mdica (debe informarlos a su mdico o a su profesional de la salud si persisten o si son molestos): -sensacin de ardor en los ojos -estreimiento -dolor de cabeza -congestin nasal -Higher education careers adviser Puede ser que esta lista no menciona todos los posibles efectos secundarios. Comunquese a su mdico por asesoramiento mdico [employer]. Usted puede informar los efectos secundarios a la FDA por  telfono al 1-800-FDA-1088. Dnde debo guardar mi medicina? Mantngala fuera del alcance de los nios. Gurdela a [employer], entre 15 y 35 grados C (14 y 27 grados F). Mantenga el envase bien cerrado. Protjala de la luz y la humedad. Deseche todo el medicamento que no haya utilizado, despus de la fecha de vencimiento. ATENCIN: Este folleto es un resumen. Puede ser que no cubra toda la posible informacin. Si usted tiene preguntas acerca de esta medicina, consulte con su mdico, su farmacutico o su profesional de Technical sales engineer.  2014, Elsevier/Gold Standard. (2006-03-11 11:33:00) Dextromethorphan; Guaifenesin capsules and ER tablets Qu es este medicamento? El DEXTROMETORFANO; GUAIFENESINA es una combinacin de un antitusivo y Building services engineer. Se utiliza para [employer]. Este medicamento no sirve para tratar una infeccin. Este medicamento puede ser utilizado para otros usos; si tiene alguna pregunta consulte con su proveedor de atencin mdica o con su farmacutico. MARCAS COMERCIALES DISPONIBLES: Alka-Seltzer Plus Max Cough, Mucus & Congestion, Alka-Seltzer Plus Mucus and Congestion, AllFen DM, Ambi, Amibid DM , Aquabid DM , Aquatab DM, Bidex-A, Bidex-DM, Cofex-DM , Coricidin HBP Chest Congetion and Cough, Duradex Forte, Duradex, Extuss LA , Fenesin DM, G-Bid DM TR, GFN 600/DM 30 , Guaidrine DM, Guaifenex DM, Guia-D , Humibid DM, Iobid DM , Mindal DM , Mucinex DM, Muco-Fen DM, Phlemex, Q-Bid DM, Relacon LAX, Respa-DM, Ru-Tuss-DM, Sudal DM, Touro DM, Tussi-Bid, Z-Cof LA, Z-Cof LAX [place] debo informar a mi profesional de la salud antes  de tomar este medicamento? Necesita saber si usted presenta alguno de los [employer] International o situaciones: -asma -bronquitis crnica -enfisema -si ha tomado un IMAO, tales como Carbex, Eldepryl, Marplan, Nardil o Parnate en los ltimos 14 das -enfermedad renal o heptica -no se puede poner derecho -una reaccin alrgica o inusual al  dextrometorfano, la guaifenesina, a otros medicamentos, alimentos, colorantes, bromuros o conservantes -si est embarazada o buscando quedar embarazada -si est amamantando a un beb Cmo debo utilizar este medicamento? Tome este medicamento por va oral con un vaso lleno de agua. Siga las instrucciones de la etiqueta del [place]. No triturar ni masticar. Tome sus dosis a intervalos regulares. No tome su medicamento con una frecuencia mayor que la indicada. Hable con su pediatra para informarse acerca del uso de este medicamento en nios. Aunque este medicamento ha sido recetado a nios tan menores como de 6 aos de edad para condiciones selectivas, las precauciones se aplican. Sobredosis: Pngase en contacto inmediatamente con un centro toxicolgico o una sala de urgencia si usted cree que haya tomado demasiado medicamento. ATENCIN: [employer] es solo para usted. No comparta este medicamento con nadie. Qu sucede si me olvido de una dosis? Si olvida una dosis, tmela lo antes posible. Si es casi la hora de la prxima dosis, tome slo esa dosis. No tome dosis adicionales o dobles. Qu puede interactuar con este medicamento? No tome esta medicina con ninguno de los siguientes medicamentos: -IMAOs, tales como [place], Eldepryl, Therapist, art, [place] y Parnate -procarbazina Esta medicina tambin puede interactuar con los siguientes medicamentos: -medicamentos para la depresin u otros trastornos mentales -otros medicamentos para resfros o IT trainer ser que esta lista no menciona todas las posibles interacciones. Informe a su profesional de KB Home	[place] de [employer] productos a base de hierbas, medicamentos de [place] o suplementos nutritivos que est tomando. Si usted fuma, consume bebidas alcohlicas o si utiliza drogas ilegales, indqueselo tambin a su profesional de KB Home	[place]. Algunas sustancias pueden interactuar con su medicamento. A qu debo estar atento al usar [employer]? Si sus sntomas no mejoran en [place], informe a su mdico. Si tiene fiebre alta, erupcin cutnea, dolor de cabeza persistente o dolor de garganta, consulte a su mdico. Beba 6 a 8 vasos de agua diariamente mientras tome este medicamento, ya que esto ayudar a Manufacturing engineer mucosidad. Puede experimentar mareos o somnolencia. No conduzca ni utilice maquinaria ni haga nada que Associate Professor en estado de alerta hasta que sepa cmo le afecta este medicamento. No se siente ni se ponga de pie con rapidez, especialmente si es un paciente de edad avanzada. Esto reduce el riesgo de mareos o [employer]. El alcohol puede interferir con el efecto de este medicamento. Evite consumir bebidas alcohlicas. Qu efectos secundarios puedo tener al [employer] este medicamento? Efectos secundarios que debe informar a su mdico o a Barrister's clerk de la salud tan pronto como sea posible: -Chief of Staff como erupcin cutnea, picazn o urticarias, hinchazn de la cara, labios o lengua -confusin -agitacin, nerviosismo, inquietud o irritabilidad -dificultad para respirar o respiracin lenta Efectos secundarios que, por lo general, no requieren atencin mdica (debe informarlos a su mdico o a Barrister's clerk de la salud si persisten o si son molestos): -dolor de cabeza -Higher education careers adviser Puede ser que esta lista no menciona todos los posibles efectos secundarios. Comunquese a su mdico por asesoramiento mdico [employer]. Usted puede informar los efectos secundarios a la FDA por telfono al 1-800-FDA-1088. Dnde  debo guardar mi medicina? Mantngala fuera del alcance de los nios. Gurdela a [employer], entre 15 y 55 grados C (71 y 49 grados F). Protjala de la luz o humedad. Deseche todo el medicamento que no haya utilizado, despus de la fecha de vencimiento. ATENCIN: Este folleto es un resumen. Puede ser que no cubra toda la posible informacin. Si usted  tiene preguntas acerca de esta medicina, consulte con su mdico, su farmacutico o su profesional de Technical sales engineer.  2014, Elsevier/Gold Standard. (2006-03-27 17:56:00)

## 2013-10-21 ENCOUNTER — Ambulatory Visit: Payer: No Typology Code available for payment source | Attending: Family Medicine | Admitting: Physical Therapy

## 2013-10-21 DIAGNOSIS — F329 Major depressive disorder, single episode, unspecified: Secondary | ICD-10-CM | POA: Insufficient documentation

## 2013-10-21 DIAGNOSIS — F3289 Other specified depressive episodes: Secondary | ICD-10-CM | POA: Insufficient documentation

## 2013-10-21 DIAGNOSIS — R5381 Other malaise: Secondary | ICD-10-CM | POA: Insufficient documentation

## 2013-10-21 DIAGNOSIS — M25669 Stiffness of unspecified knee, not elsewhere classified: Secondary | ICD-10-CM | POA: Insufficient documentation

## 2013-10-21 DIAGNOSIS — M25569 Pain in unspecified knee: Secondary | ICD-10-CM | POA: Insufficient documentation

## 2013-10-21 DIAGNOSIS — IMO0001 Reserved for inherently not codable concepts without codable children: Secondary | ICD-10-CM | POA: Insufficient documentation

## 2013-10-22 ENCOUNTER — Ambulatory Visit: Payer: No Typology Code available for payment source

## 2013-10-26 NOTE — Progress Notes (Signed)
Case discussed with Dr. Hoffman soon after the resident saw the patient.  We reviewed the resident's history and exam and pertinent patient test results.  I agree with the assessment, diagnosis, and plan of care documented in the resident's note. 

## 2013-10-28 ENCOUNTER — Ambulatory Visit: Payer: No Typology Code available for payment source | Admitting: Physical Therapy

## 2013-10-30 ENCOUNTER — Ambulatory Visit: Payer: No Typology Code available for payment source | Admitting: Physical Therapy

## 2013-12-01 ENCOUNTER — Ambulatory Visit: Payer: Self-pay

## 2014-05-24 ENCOUNTER — Encounter: Payer: Self-pay | Admitting: Internal Medicine

## 2014-06-29 IMAGING — CR DG KNEE COMPLETE 4+V*R*
4 series · 4 of 4 positions shown · non-contrast
Comparison: None.

CLINICAL DATA: Pain

EXAM:
RIGHT KNEE - COMPLETE 4+ VIEW

[w knee ap right]
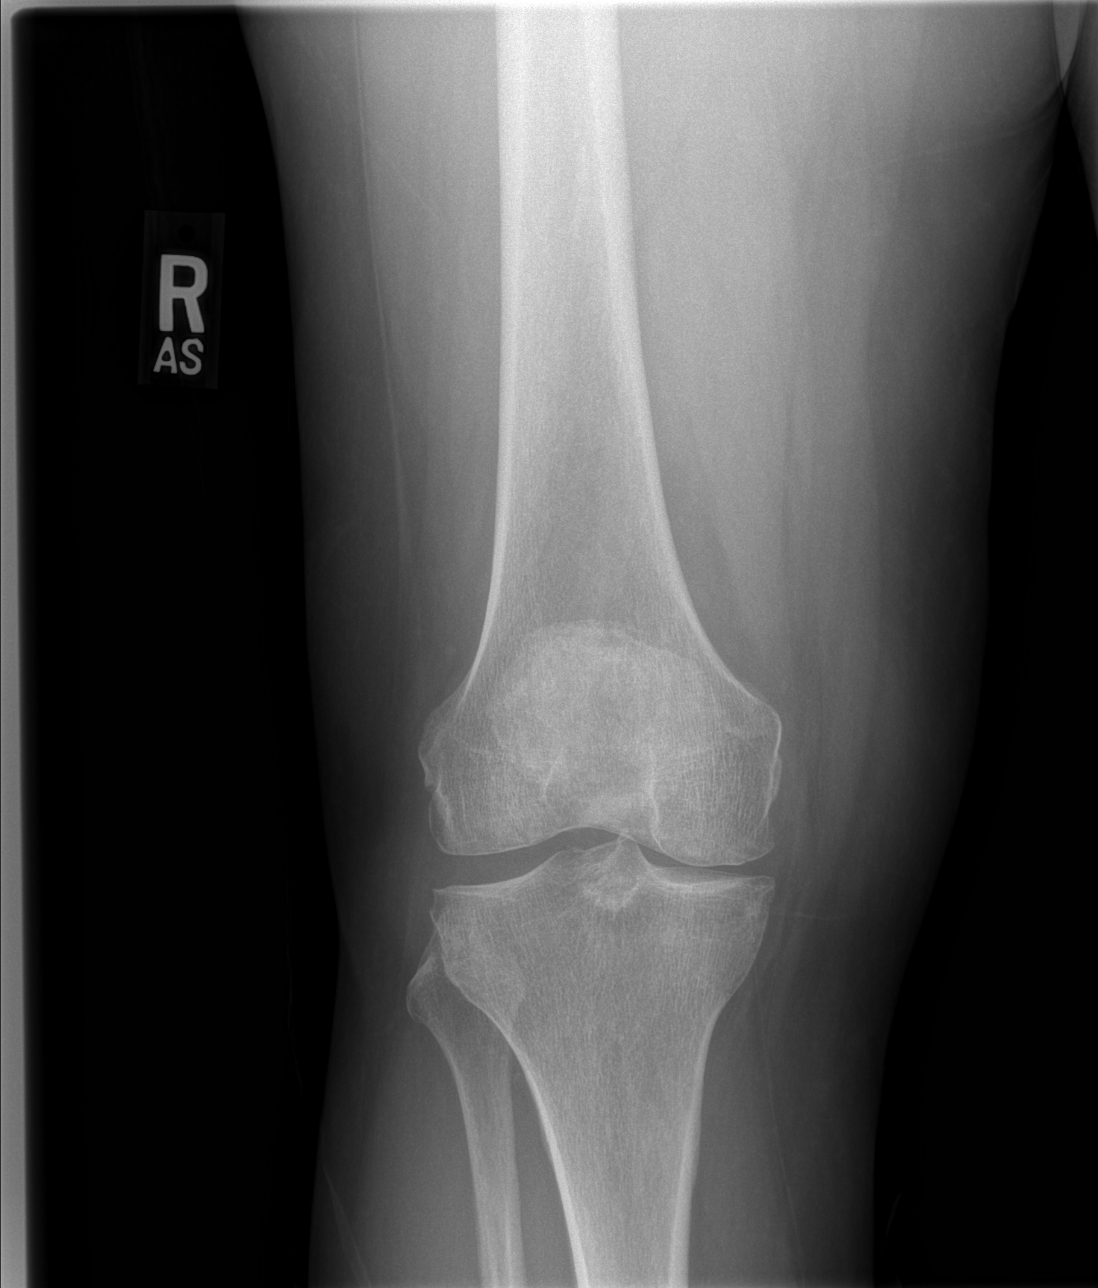

[w knee lat. right]
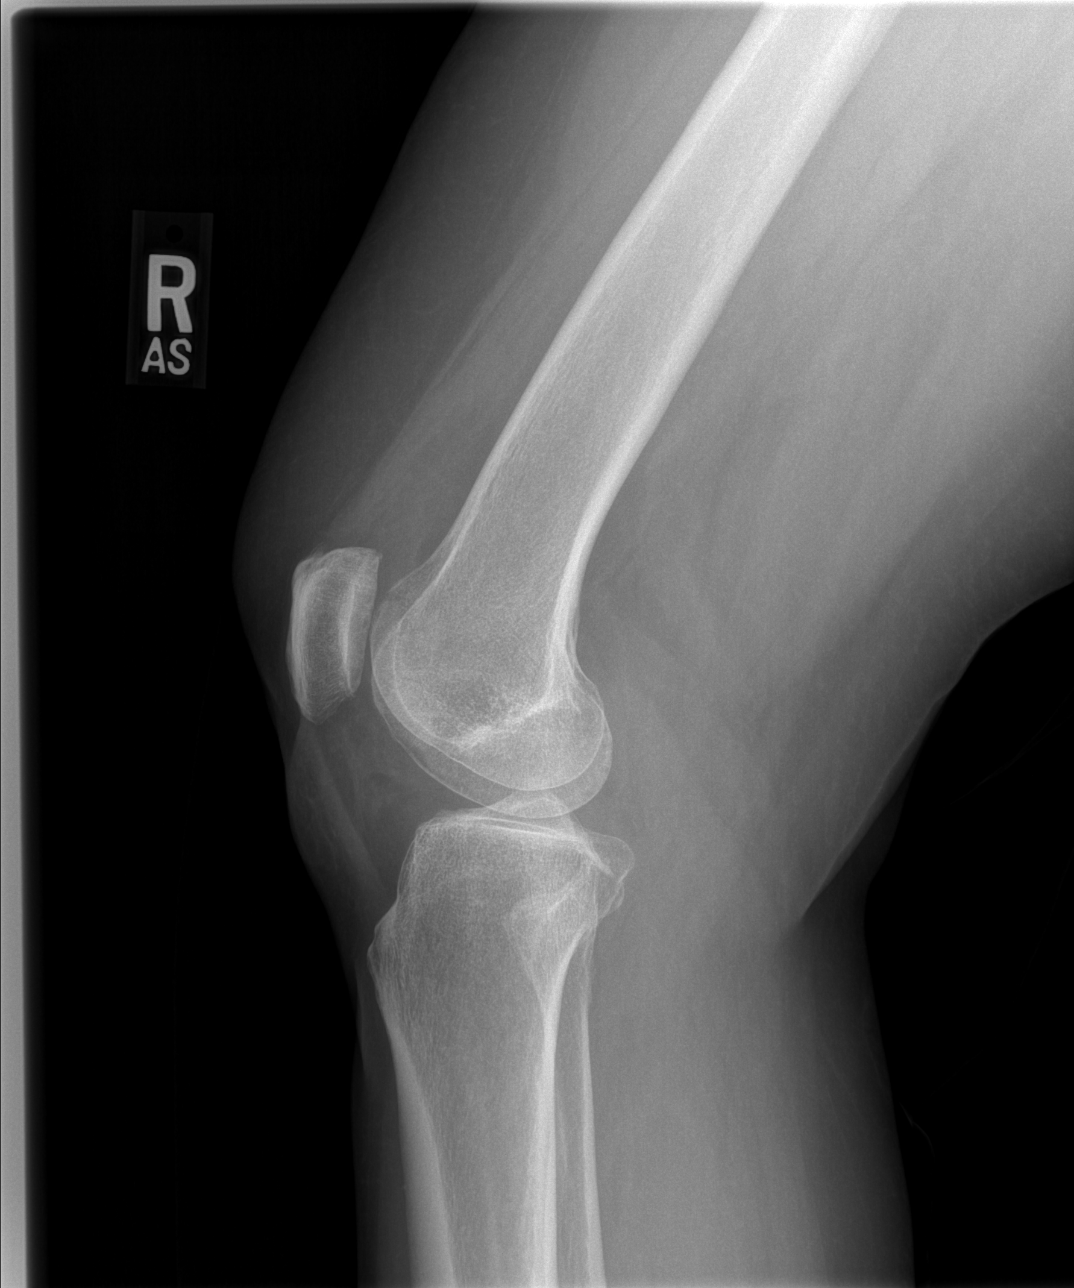

[t knee ap right]
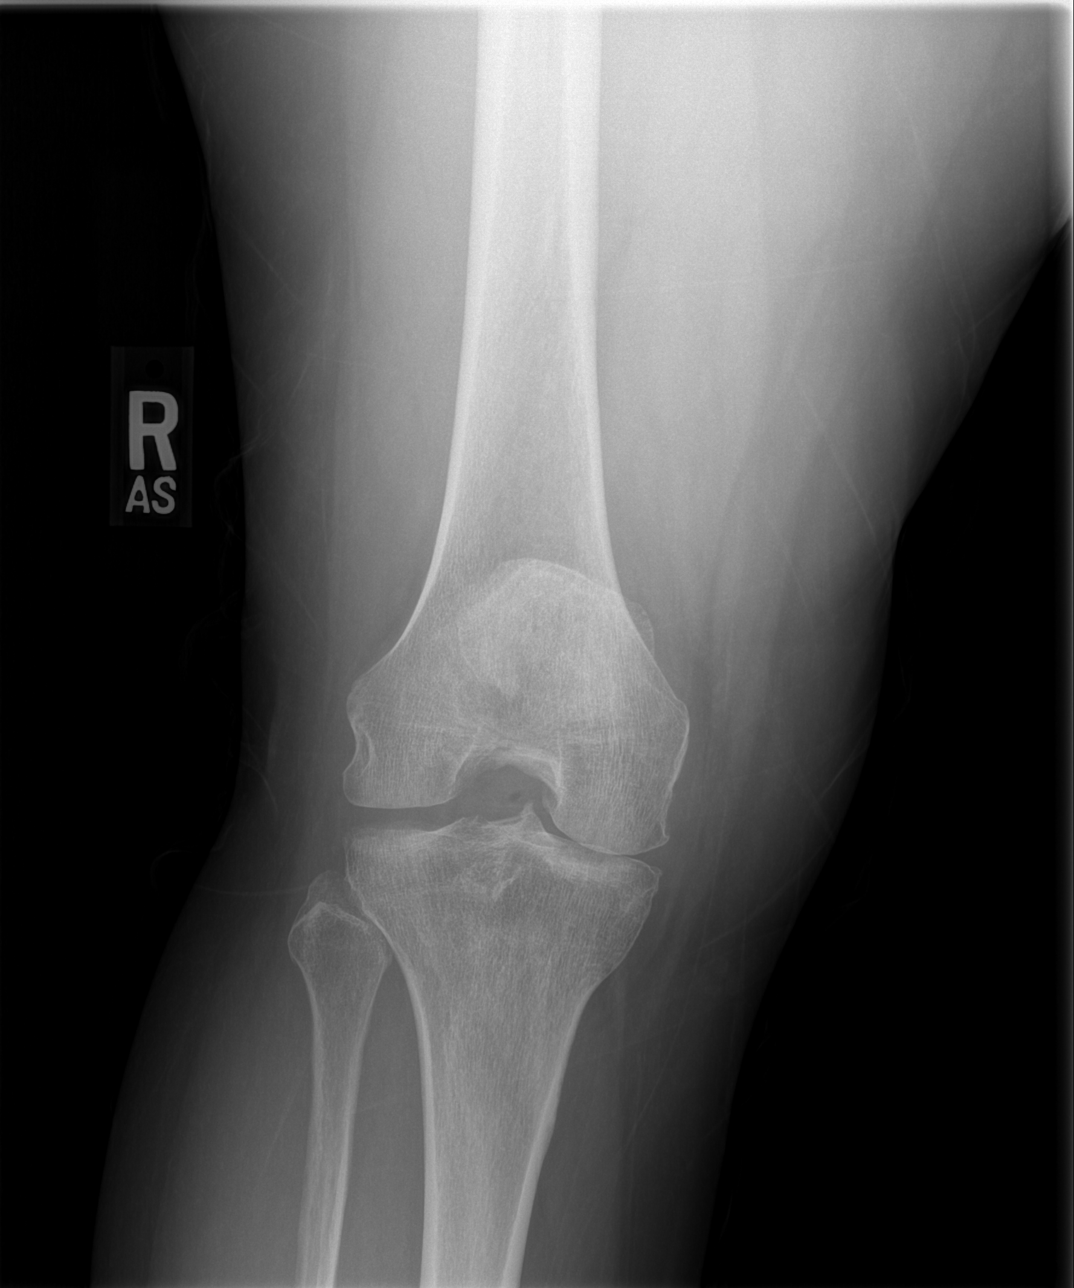

[view not recorded]
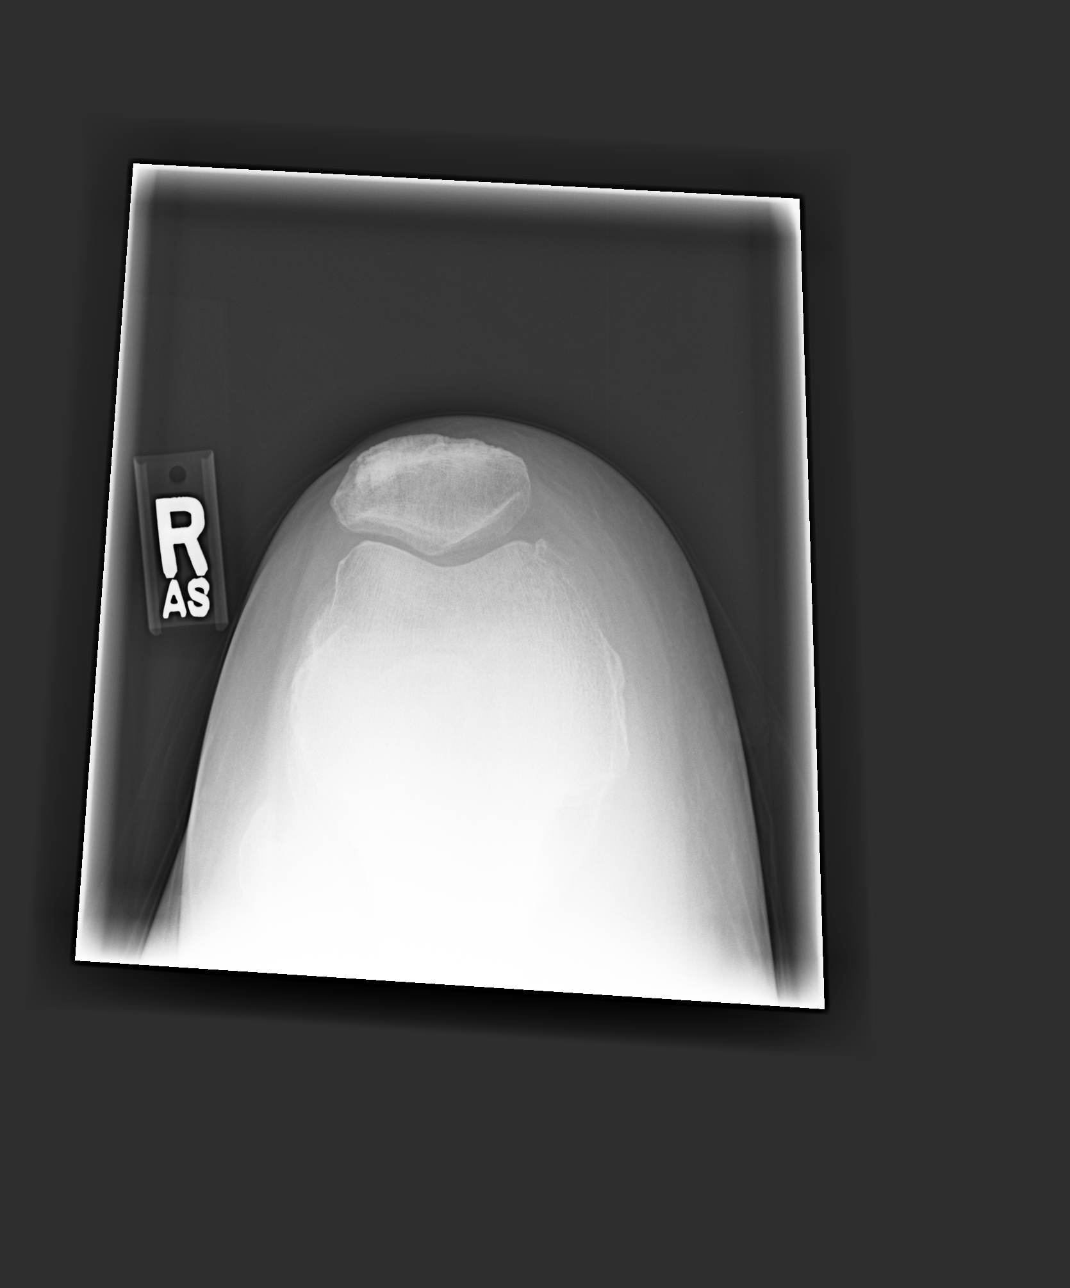

[4 of 4 positions shown; findings below may reference images not displayed]

FINDINGS: Frontal, lateral, cortical tunnel, and sunrise patellar images were
obtained. There is no fracture, dislocation, or effusion. There is
moderate narrowing medially. There is mild patellofemoral joint
space narrowing. There is minimal spurring in all compartments. No
erosive change.
IMPRESSION: Osteoarthritic change.  No fracture or effusion.

## 2015-01-12 ENCOUNTER — Encounter: Payer: Self-pay | Admitting: Internal Medicine

## 2015-01-12 ENCOUNTER — Ambulatory Visit (INDEPENDENT_AMBULATORY_CARE_PROVIDER_SITE_OTHER): Payer: Self-pay | Admitting: Internal Medicine

## 2015-01-12 VITALS — BP 117/75 | HR 58 | Temp 97.8°F

## 2015-01-12 DIAGNOSIS — M25562 Pain in left knee: Secondary | ICD-10-CM

## 2015-01-12 MED ORDER — MELOXICAM 15 MG PO TABS: 15.0000 mg | ORAL_TABLET | Freq: Every day | ORAL | Status: DC

## 2015-01-12 NOTE — Assessment & Plan Note (Addendum)
Assessment: Most likely diagnosis left knee osteoarthritis. X-ray performed in March 2015 did not reveal osteoarthritic changes, but the right knee revealed changes. She has had good relief with steroid injection previously.  Plan: 1. Labs/imaging: None 2. Therapy: knee injection in left knee today. Can continue with Tylenol. I encouraged her to cut back on Ibuprofen. Hopefully her pain will be better after steroid infection. 3. Follow up in 2 weeks to reassess pain and consider injection in the right knee as well.   Consent obtained and verified.  Time-out conducted.  Noted no overlying erythema, induration, or other signs of local infection.  Skin prepped in a sterile fashion.  Topical analgesic spray used for local anaesthesia Joint: Left knee. Introduced the needle using the lateral infrapatellar approach  Meds: 2 cc 1% lidocaine, 1 cc (40mg ) Kenalog. Patient tolerated procedure very well without complications Procedure supervised by Dr Daryll Drown Advised to call if fevers/chills, erythema, induration, drainage, or persistent bleeding.

## 2015-01-12 NOTE — Patient Instructions (Addendum)
General Instructions: We have given you an injection in the left knee today  Please use Tylenol 1-2 pills every 8 hours as needed Only take Meloxicam 15 mg daily as needed if the pain persists. Please come back in 2 weeks  Thank you for bringing your medicines today. This helps [place] keep you safe from mistakes.   Progress Toward Treatment Goals:  No flowsheet data found.  Self Care Goals & Plans:  No flowsheet data found.  No flowsheet data found.   Care Management & Community Referrals:  No flowsheet data found.

## 2015-01-12 NOTE — Progress Notes (Signed)
Patient ID: Heidi Galvan, female   DOB: 10-29-1965, 49 y.o.   MRN: [id number]   Subjective:   HPI: Heidi Galvan is a 49 y.o. woman with past medical history below presents for an acute visit due to increased left knee pain.  Increased left knee pain: Patient has a history of osteoarthritis in both knees. She usually takes acetaminophen 325 mg 1-2 pills every 12 hours as needed for pain, and it usually manages to control her pain well. However, about 2 weeks ago, her pain in the left knee increased. This was further exacerbated after she helped a friend move some household objects in the house since then her pain has been 10/10. She started taking ibuprofen 800 mg every 6 hours, but without much relief. Her pain would on reduced from a 10 to an 8/10. She denies any constitutional symptoms. She is been able to ambulate but with significant difficulty. Her previous x-ray of the right knee in March 2015 revealed osteoarthritic changes. Left knee x-ray was negative at that time. She has received steroidal injections from sports medicine center previously which have relieved the pain for several months. She would like to receive the injections in both knees today. However, I encouraged her to do one knee and maybe she can come back in 2 weeks to consider the other knee as well. If she has received some relief in the first in the end we can consider injecting in the right knee as well.   Past Medical History  Diagnosis Date  . Menometrorrhagia   . Migraine   . Depression   . Iron deficiency anemia     ROS: Constitutional:  Denies fevers, chills, diaphoresis, appetite change and fatigue.  Respiratory: Denies SOB, DOE, cough, chest tightness, and wheezing.  CVS: No chest pain, palpitations and leg swelling.  GI: No abdominal pain, nausea, vomiting, bloody stools GU: No dysuria, frequency, hematuria, or flank pain.  Psych: No depression symptoms. No SI or SA.    Objective:  Physical  Exam: Filed Vitals:   01/12/15 1527  BP: 117/75  Pulse: 58  Temp: 97.8 F (36.6 C)  TempSrc: Oral  SpO2: 100%   General: Well nourished. No acute distress.  HEENT: Normal oral mucosa. MMM.  Lungs: CTA bilaterally. No wheezing. Heart: RRR; no extra sounds or murmurs  Abdomen: Non-distended, normal bowel sounds, soft, nontender; no hepatosplenomegaly  Extremities: no edema. Pulse are good bilaterally.  Left knee: Tenderness with crepitus. Range of motion is not limited. However, there is increased pain with movement. I do not appreciate any fluid accumulation. Right knee exam with just mild tenderness. No other abnormalities. Neurologic: Normal EOM,  Alert and oriented x3. No obvious neurologic/cranial nerve deficits.  Assessment & Plan:  Discussed case with Dr Daryll Drown See problem based charting for assessment and plan.

## 2015-01-13 ENCOUNTER — Ambulatory Visit: Payer: Self-pay

## 2015-01-13 NOTE — Progress Notes (Signed)
Internal Medicine Clinic Attending  I saw and evaluated the patient.  I personally confirmed the key portions of the history and exam documented by Dr. Alice Rieger and I reviewed pertinent patient test results.  The assessment, diagnosis, and plan were formulated together and I agree with the documentation in the resident's note.  I was present for procedure.

## 2015-01-13 NOTE — Addendum Note (Signed)
Addended by: Gilles Chiquito B on: 01/13/2015 03:33 PM   Modules accepted: Level of Service

## 2015-01-28 ENCOUNTER — Ambulatory Visit (INDEPENDENT_AMBULATORY_CARE_PROVIDER_SITE_OTHER): Payer: Self-pay | Admitting: Internal Medicine

## 2015-01-28 ENCOUNTER — Encounter: Payer: Self-pay | Admitting: Internal Medicine

## 2015-01-28 VITALS — BP 132/77 | HR 64 | Temp 98.2°F | Wt 166.1 lb

## 2015-01-28 DIAGNOSIS — M25562 Pain in left knee: Secondary | ICD-10-CM

## 2015-01-28 NOTE — Patient Instructions (Addendum)
1. Please take 650mg  Tylenol (1 tablet) every 6 hours as needed for pain.  You can also use over-the-counter Aspercreme on your knee to help with pain.  Continue to wrap the knee to help with walking.  Avoid activities that make your pain worse.  I will check with Marlana Latus about your [employer] status so we can get imaging of your knee and refer you to Sports Med or Orthopedics for further management.   2. Please take all medications as prescribed.    3. If you have worsening of your symptoms or new symptoms arise, please call the clinic ([phone]), or go to the ER immediately if symptoms are severe.

## 2015-01-28 NOTE — Progress Notes (Signed)
Subjective:    Patient ID: Heidi Galvan, female    DOB: 1965/09/16, 49 y.o.   MRN: [id number]  HPI Comments: Heidi Galvan is a 49 year old woman with PMH as below here with c/o knee pain.  Please see problem based charting for assessment and plan.     Past Medical History  Diagnosis Date  . Menometrorrhagia   . Migraine   . Depression   . Iron deficiency anemia    Current Outpatient Prescriptions on File Prior to Visit  Medication Sig Dispense Refill  . albuterol (PROVENTIL HFA;VENTOLIN HFA) 108 (90 BASE) MCG/ACT inhaler Inhale 2 puffs into the lungs every 6 (six) hours as needed for wheezing or shortness of breath. 1 Inhaler 0  . ferrous sulfate 325 (65 FE) MG tablet Take 1 tablet (325 mg total) by mouth 3 (three) times daily with meals. 270 tablet 3  . FLUoxetine (PROZAC) 20 MG capsule Take 1 capsule (20 mg total) by mouth daily. 30 capsule 11  . fluticasone (FLONASE) 50 MCG/ACT nasal spray Place 1 spray into the nose daily. 16 g 3  . meloxicam (MOBIC) 15 MG tablet Take 1 tablet (15 mg total) by mouth daily. 30 tablet 1  . montelukast (SINGULAIR) 10 MG tablet Take 1 tablet (10 mg total) by mouth daily. 30 tablet 2  . sodium chloride (OCEAN NASAL SPRAY) 0.65 % nasal spray Place 1 spray into the nose as needed for congestion. 45 mL 3  . traZODone (DESYREL) 100 MG tablet Take 0.5 tablets (50 mg total) by mouth at bedtime. 30 tablet 6   No current facility-administered medications on file prior to visit.    Review of Systems  Constitutional: Negative for fever and chills.  Respiratory: Negative for shortness of breath.   Cardiovascular: Negative for chest pain.  Gastrointestinal: Negative for nausea and vomiting.  Musculoskeletal: Positive for joint swelling and arthralgias.       B/L knee pain and intermittent swelling.       Filed Vitals:   01/28/15 1518  BP: 132/77  Pulse: 64  Temp: 98.2 F (36.8 C)  TempSrc: Oral  Weight: 166 lb 1.6 oz (75.342 kg)  SpO2: 99%       Objective:   Physical Exam  Constitutional: She is oriented to person, place, and time. She appears well-developed. No distress.  HENT:  Head: Normocephalic and atraumatic.  Mouth/Throat: Oropharynx is clear and moist. No oropharyngeal exudate.  Eyes: EOM are normal. Pupils are equal, round, and reactive to light.  Neck: Neck supple.  Cardiovascular: Normal rate, regular rhythm and normal heart sounds.  Exam reveals no gallop and no friction rub.   No murmur heard. Pulmonary/Chest: Effort normal and breath sounds normal. No respiratory distress. She has no wheezes. She has no rales.  Abdominal: Soft. Bowel sounds are normal. She exhibits no distension. There is no tenderness. There is no rebound.  Musculoskeletal: Normal range of motion. She exhibits edema and tenderness.  B/L knee with full ROM.  + TTP along medial joint line of left knee.  Left knee appears mildly swollen compared to right.  There is no redness or increased warmth.  There is no pain with valgus or varus stress.  Neg McMurray and drawer tests.  Neurological: She is alert and oriented to person, place, and time. No cranial nerve deficit.  Skin: Skin is warm. She is not diaphoretic.  Psychiatric: She has a normal mood and affect. Her behavior is normal. Judgment and thought content normal.  Vitals  reviewed.         Assessment & Plan:  Please see problem based charting for assessment and plan.

## 2015-01-30 NOTE — Assessment & Plan Note (Addendum)
Review of EPIC reveals the patient has had B/L knee pain since a fall in 2014  She failed conservative treatment and was treated with steroid injection to both knees.  She continued to have pain and was referred to Sports Med physician who felt she either had OA or meniscal injury and left knee pain was due to compensation.  Xrays ordered by Sports Med in 2015 were c/w mild OA on the right and negative on the left.  Sports Med notes indicate she had improvement after steroid injection and PT.  The patient was seen in [facility] two weeks ago for recurrent knee pain exacerbated by moving furniture.  She was taking NSAID and Tylenol without relief.  She received a steroid injection to the left knee.  She has been taking two 650mg  Tylenol C78L.  She is no longer taking ibuprofen.  She tried Mobic but feels it did not work.  She says the left knee feels a little better since injection but not much (9/10 pain vs 10/10 previously).  She says both knees swell from time to time.  She is able to walk but has pain in the knee with walking.   - increase frequency of Tylenol (one 650mg  tylenol q6h) - resume Mobic daily with food - buy OTC Aspercreme (or similar) for topical use - She had improvement with PT in the past but she has no insurance or [employer] Card at present; review of EPIC shows she was missing paperwork for [employer] (visit on 01/13/15); I will check with financial counselor regarding her [facility] eligibility and refer to PT and Sports Med once she has [employer].

## 2015-01-31 NOTE — Progress Notes (Signed)
Medicine attending: Medical history, presenting problems, physical findings, and medications, reviewed with Dr Alex Wilson on the day of the patient visit   and I concur with her evaluation and management plan. 

## 2015-02-02 ENCOUNTER — Other Ambulatory Visit: Payer: Self-pay | Admitting: Internal Medicine

## 2015-02-02 DIAGNOSIS — M25569 Pain in unspecified knee: Secondary | ICD-10-CM

## 2015-02-23 ENCOUNTER — Ambulatory Visit
Admission: RE | Admit: 2015-02-23 | Discharge: 2015-02-23 | Disposition: A | Discharge status: Home / Self Care | Payer: No Typology Code available for payment source | Source: Ambulatory Visit | Attending: Family Medicine | Admitting: Family Medicine

## 2015-02-23 ENCOUNTER — Ambulatory Visit (INDEPENDENT_AMBULATORY_CARE_PROVIDER_SITE_OTHER): Payer: Self-pay | Admitting: Family Medicine

## 2015-02-23 ENCOUNTER — Encounter: Payer: Self-pay | Admitting: Family Medicine

## 2015-02-23 VITALS — BP 121/97 | HR 64 | Ht 60.0 in | Wt 162.0 lb

## 2015-02-23 DIAGNOSIS — B9789 Other viral agents as the cause of diseases classified elsewhere: Secondary | ICD-10-CM

## 2015-02-23 DIAGNOSIS — M25562 Pain in left knee: Secondary | ICD-10-CM

## 2015-02-23 DIAGNOSIS — J069 Acute upper respiratory infection, unspecified: Secondary | ICD-10-CM

## 2015-02-23 MED ORDER — MELOXICAM 15 MG PO TABS: 15.0000 mg | ORAL_TABLET | Freq: Every day | ORAL | Status: DC

## 2015-02-23 MED ORDER — ALBUTEROL SULFATE HFA 108 (90 BASE) MCG/ACT IN AERS: 2.0000 | INHALATION_SPRAY | Freq: Four times a day (QID) | RESPIRATORY_TRACT | Status: DC | PRN

## 2015-02-23 NOTE — Progress Notes (Signed)
Patient ID: Heidi Galvan, female   DOB: April 27, 1966, 49 y.o.   MRN: [id number]   Interpreter for visit is Chelsea Aus

## 2015-02-23 NOTE — Progress Notes (Signed)
  [name] - 49 y.o. female MRN [id number]  Date of birth: [birth year]  SUBJECTIVE:  Including CC & ROS.  Heidi Galvan is a 49 y.o. female who presents today for left knee pain.    Knee Pain left, initial visit - patient be evaluated for initial visit for ongoing left knee pain. She was referred by her PCP for further evaluation. This is been ongoing now for about 3-4 months when she was walking her dog and fell onto her left knee while flexed. She denies immediate effusion or inability to flex the knee at that time. However she started to have nondescript lateral knee pain that developed over the last 3-4 months and has worsened. Pain is worse in any type of deep flexion and she has tried Mobic which has helped a little bit along with Tylenol 650 mg 4 times a day. She did have a steroid injection performed in the beginning of July that did help a little bit. She has not had x-rays of the left knee since 2015. She denies frank locking, catching, giving way or gross instability. No previous injury to the left knee. She does have moderate osteoarthritis of the right medial knee joint compartment.  PMHx - Updated and reviewed.  Contributory factors include: Right knee OA PSHx - Updated and reviewed.  Contributory factors include:  Noncontributory FHx - Updated and reviewed.  Contributory factors include:  Noncontributory Medications - Mobic and Tylenol   Exam:  Filed Vitals:   02/23/15 1459  BP: 121/97  Pulse: 64    Gen: NAD Cardiorespiratory - Normal respiratory effort/rate.  RRR Left Knee:  Normal to inspection with no erythema or effusion or obvious bony abnormalities.  No obvious Baker's cysts Tenderness palpation along the lateral joint line  No TTP along infrapatellar or pes anserine bursas.   ROM normal in flexion (135 degrees) and extension (0 degrees) and lower leg rotation. Ligaments with solid consistent endpoints Positive Mcmurray's and provocative meniscal tests  including Thessaly and Apley compression testing  Non painful patellar compression.  Normal Patellar glide.  No apprehension  Patellar and quadriceps tendons unremarkable. Hamstring and quadriceps strength is normal.  Neurovascularly intact B/L LE

## 2015-02-23 NOTE — Assessment & Plan Note (Signed)
Patient with small effusion on the left knee. Skin ongoing now for several months she has not had x-rays to date. Differential includes soft tissue injury including meniscus versus underlying osteoarthritis versus another cause. -We will obtain 4 views standing of the left knee. -Continue with Mobic and Tylenol as well as compression sleeve and she will follow-up in 2-3 weeks review her x-rays. -She still having symptoms at that time we will consider repeat injection. -If she does indeed have an underlying meniscus injury, we will obtain an MRI to further evaluate for this. The fact that she has minimal effusion and is able to fully flex her knee without pain and ambulate with minimal pain, she may be able to go to physical therapy to help with quad strength and avoid surgical intervention. -Could consider further medications including tramadol and Cymbalta.

## 2015-03-16 ENCOUNTER — Ambulatory Visit (INDEPENDENT_AMBULATORY_CARE_PROVIDER_SITE_OTHER): Payer: No Typology Code available for payment source | Admitting: Family Medicine

## 2015-03-16 ENCOUNTER — Encounter: Payer: Self-pay | Admitting: Family Medicine

## 2015-03-16 VITALS — BP 139/74 | Ht 60.0 in | Wt 162.0 lb

## 2015-03-16 DIAGNOSIS — M25562 Pain in left knee: Secondary | ICD-10-CM

## 2015-03-16 NOTE — Progress Notes (Signed)
  Heidi Galvan - 49 y.o. female MRN [id number]  Date of birth: October 04, 1965  SUBJECTIVE:  Including CC & ROS.  Heidi Galvan is a 49 y.o. female who presents today for left knee pain.    Knee Pain left, visit 02/23/15 - patient be evaluated for initial visit for ongoing left knee pain. She was referred by her PCP for further evaluation. This is been ongoing now for about 3-4 months when she was walking her dog and fell onto her left knee while flexed. She denies immediate effusion or inability to flex the knee at that time. However she started to have nondescript lateral knee pain that developed over the last 3-4 months and has worsened. Pain is worse in any type of deep flexion and she has tried Mobic which has helped a little bit along with Tylenol 650 mg 4 times a day. She did have a steroid injection performed in the beginning of July that did help a little bit. She has not had x-rays of the left knee since 2015. She denies frank locking, catching, giving way or gross instability. No previous injury to the left knee. She does have moderate osteoarthritis of the right medial knee joint compartment.  Visit 03/16/15 - patient having decreased pain around 6/10 at this time. She is able to perform all her daily activities and is not interested at surgery at this time. She denies any repeat locking catching or giving way. She continues with meloxicam and Tylenol and is doing well on this currently.  PMHx - Updated and reviewed.  Contributory factors include: Right knee OA PSHx - Updated and reviewed.  Contributory factors include:  Noncontributory FHx - Updated and reviewed.  Contributory factors include:  Noncontributory Medications - Mobic and Tylenol   Exam:  Gen: NAD Cardiorespiratory - Normal respiratory effort/rate.  RRR Left Knee:  Normal to inspection with no erythema or effusion or obvious bony abnormalities.  No obvious Baker's cysts Tenderness palpation along the lateral joint line  No  TTP along infrapatellar or pes anserine bursas.   ROM normal in flexion (135 degrees) and extension (0 degrees) and lower leg rotation. Ligaments with solid consistent endpoints Positive Mcmurray's and provocative meniscal tests including Thessaly and Apley compression testing  Non painful patellar compression.  Normal Patellar glide.  No apprehension  Patellar and quadriceps tendons unremarkable. Hamstring and quadriceps strength is normal.  Neurovascularly intact B/L LE   X-rays L knee 02/23/2015 -   There is no acute bony abnormality of the left knee. There is chronic narrowing of the medial joint compartment consistent with osteoarthritic change. Milder osteoarthritic changes are noted elsewhere. Stable soft tissue fullness in the anterior infrapatellar soft tissues.

## 2015-03-16 NOTE — Assessment & Plan Note (Signed)
Patient currently doing well and is able to function on her own.  X-rays from 8/3 showing medial joint space narrowing of the left knee. -Continue conservative management with Mobic, Tylenol, and will have her start a compression sleeve to the area. -she will call back when she is considering a repeat injection. -If she continues to have ongoing pain especially with effusion and minimal flexion, we will go ahead and obtain an MRI to evaluate for underlying meniscal pathology.

## 2015-04-04 ENCOUNTER — Encounter: Payer: Self-pay | Admitting: Internal Medicine

## 2015-04-04 ENCOUNTER — Ambulatory Visit (INDEPENDENT_AMBULATORY_CARE_PROVIDER_SITE_OTHER): Payer: No Typology Code available for payment source | Admitting: Internal Medicine

## 2015-04-04 VITALS — BP 132/73 | HR 66 | Temp 98.2°F | Ht 60.0 in | Wt 165.1 lb

## 2015-04-04 DIAGNOSIS — B9789 Other viral agents as the cause of diseases classified elsewhere: Principal | ICD-10-CM

## 2015-04-04 DIAGNOSIS — J069 Acute upper respiratory infection, unspecified: Secondary | ICD-10-CM

## 2015-04-04 MED ORDER — PHENOL 1.4 % MT LIQD: 1.0000 | OROMUCOSAL | Status: DC | PRN

## 2015-04-04 MED ORDER — ALBUTEROL SULFATE HFA 108 (90 BASE) MCG/ACT IN AERS: 2.0000 | INHALATION_SPRAY | Freq: Four times a day (QID) | RESPIRATORY_TRACT | Status: DC | PRN

## 2015-04-04 MED ORDER — GUAIFENESIN ER 600 MG PO TB12: 600.0000 mg | ORAL_TABLET | Freq: Two times a day (BID) | ORAL | Status: DC | PRN

## 2015-04-04 NOTE — Progress Notes (Signed)
Internal Medicine Clinic Attending  Case discussed with Dr. Truong at the time of the visit.  We reviewed the resident's history and exam and pertinent patient test results.  I agree with the assessment, diagnosis, and plan of care documented in the resident's note.  

## 2015-04-04 NOTE — Progress Notes (Signed)
   Subjective:    Patient ID: Shy Guallpa, female    DOB: [birth year], 49 y.o.   MRN: [id number]  HPI Pt 49 y/o F w/ PMHx of depression and hx of iron def anemia who presents for an acute visit for cough and back pain. Please see problem list for further details.     Review of Systems  Constitutional: Positive for fever and fatigue.  HENT: Positive for sinus pressure and sore throat.   Respiratory: Positive for cough. Negative for shortness of breath.   Gastrointestinal: Positive for nausea and vomiting. Negative for diarrhea.  Musculoskeletal: Positive for back pain.       Objective:   Physical Exam  Constitutional: She appears well-developed and well-nourished.  HENT:  Post nasal drip, tender to palpation of maxillary sinuses  Neck: Normal range of motion.  Cardiovascular: Normal rate, regular rhythm and normal heart sounds.   No murmur heard. Pulmonary/Chest: Effort normal and breath sounds normal. No respiratory distress. She has no wheezes. She has no rales.  Abdominal: Soft. Bowel sounds are normal. She exhibits no distension. There is no tenderness.  Lymphadenopathy:    She has no cervical adenopathy.  Neurological: She is alert.  Skin: Skin is warm and dry.          Assessment & Plan:  Please see problem based assessment and plan.

## 2015-04-04 NOTE — Patient Instructions (Signed)
You can wear the face mask and wash your hands frequently while you are feeling unwell and when you are around your daughter.   Use chloraseptic throat spray for your sore throat and mucinex 600mg  twice a day as needed for your congestion. If you do not feel better in 7-10 days then make an appointment to be seen in the clinic.

## 2015-04-04 NOTE — Assessment & Plan Note (Addendum)
Pt complaining of 2 days of dry cough, HA, back pain, and fatigue. She states her back pain in located at her upper back where her lungs are and is exacerbated by cough. HA is located along her forehead. Cough is not productive of phelgm and she vomited 3 times last night from coughing so much. She has nausea, facial pressure, sore throat, and a measure temp of 101.31F last night. She has tried nyquil which has only helped with sleep. Her son who lives with her is also sick and has been coughing. Likely pt has a viral URI or sinusitis that will self resolve.   - rx for chloraseptic throat spray and mucinex 600mg  BID to treat for symptoms - refilled albuterol inhaler which helps with her sx - RTC in 1 week if sx do not improve - educated on wearing face mask and frequent hand hygeine to prevent spread of germs

## 2015-06-14 ENCOUNTER — Encounter: Payer: Self-pay | Admitting: Student

## 2015-06-15 ENCOUNTER — Ambulatory Visit: Payer: No Typology Code available for payment source | Admitting: Family Medicine

## 2015-06-15 ENCOUNTER — Encounter: Payer: Self-pay | Admitting: Family Medicine

## 2015-06-15 ENCOUNTER — Ambulatory Visit (INDEPENDENT_AMBULATORY_CARE_PROVIDER_SITE_OTHER): Payer: No Typology Code available for payment source | Admitting: Family Medicine

## 2015-06-15 VITALS — BP 120/62 | Ht 62.0 in | Wt 155.0 lb

## 2015-06-15 DIAGNOSIS — M25562 Pain in left knee: Secondary | ICD-10-CM

## 2015-06-15 MED ORDER — METHYLPREDNISOLONE ACETATE 40 MG/ML IJ SUSP
40.0000 mg | Freq: Once | INTRAMUSCULAR | Status: AC
  Administered 2015-06-15: 40 mg via INTRA_ARTICULAR

## 2015-06-15 MED ORDER — MELOXICAM 15 MG PO TABS: 15.0000 mg | ORAL_TABLET | Freq: Every day | ORAL | Status: DC

## 2015-06-15 NOTE — Assessment & Plan Note (Signed)
X-rays from 8/3 showing medial joint space narrowing of the left knee.  DJD definitely playing a part but unsure if she also has concurrent pathology in meniscus  -Continue conservative management with Mobic, Tylenol, and a compression sleeve to the area. -Repeat injection today  -If she continues to have ongoing pain especially with effusion and minimal flexion, we will go ahead and obtain an MRI to evaluate for underlying meniscal pathology.  Informed consent obtained and placed in chart.  Time out performed.  Area cleaned with iodine x 3 and wiped clear with alcohol swab.  Using 21 1/2 gauge needle 2 cc Depo 40 mg and 4 cc's 1% Lidocaine were injected in L knee using medial approach.  Sterile bandage placed.  Patient tolerated procedure well.  No complications.

## 2015-06-15 NOTE — Progress Notes (Signed)
  [name] - 49 y.o. female MRN [id number]  Date of birth: [birth year]  SUBJECTIVE:  Including CC & ROS.  Heidi Galvan is a 50 y.o. female who presents today for left knee pain.    Knee Pain left, visit 02/23/15 - patient be evaluated for initial visit for ongoing left knee pain. She was referred by her PCP for further evaluation. This is been ongoing now for about 3-4 months when she was walking her dog and fell onto her left knee while flexed. She denies immediate effusion or inability to flex the knee at that time. However she started to have nondescript lateral knee pain that developed over the last 3-4 months and has worsened. Pain is worse in any type of deep flexion and she has tried Mobic which has helped a little bit along with Tylenol 650 mg 4 times a day. She did have a steroid injection performed in the beginning of July that did help a little bit. She has not had x-rays of the left knee since 2015. She denies frank locking, catching, giving way or gross instability. No previous injury to the left knee. She does have moderate osteoarthritis of the right medial knee joint compartment.  Visit 03/16/15 - patient having decreased pain around 6/10 at this time. She is able to perform all her daily activities and is not interested at surgery at this time. She denies any repeat locking catching or giving way. She continues with meloxicam and Tylenol and is doing well on this currently.  Visit 06/15/15 - Pt starting to have continuing pain again.  Previous injection worked about 3 months for her.  Not interested in surgery but if starts occuring more, would consider MRI to further delineate etiology.  Compliant with Mobic/Tylenol w/o ADR.   PMHx - Updated and reviewed.  Contributory factors include: Right knee OA PSHx - Updated and reviewed.  Contributory factors include:  Noncontributory FHx - Updated and reviewed.  Contributory factors include:  Noncontributory Medications - Mobic and  Tylenol   Exam:  Gen: NAD Cardiorespiratory - Normal respiratory effort/rate.  RRR Left Knee:  Normal to inspection with no erythema or effusion or obvious bony abnormalities.  No obvious Baker's cysts Tenderness palpation along the medial/lateral joint line  No TTP along infrapatellar or pes anserine bursas.   ROM normal in flexion (135 degrees) and extension (0 degrees) and lower leg rotation. Ligaments with solid consistent endpoints Positive Mcmurray's and provocative meniscal tests including Thessaly and Apley compression testing  Non painful patellar compression.  Normal Patellar glide.  No apprehension  Patellar and quadriceps tendons unremarkable. Hamstring and quadriceps strength is normal.  Neurovascularly intact B/L LE   X-rays L knee 02/23/2015 -   There is no acute bony abnormality of the left knee. There is chronic narrowing of the medial joint compartment consistent with osteoarthritic change. Milder osteoarthritic changes are noted elsewhere. Stable soft tissue fullness in the anterior infrapatellar soft tissues.

## 2015-06-22 ENCOUNTER — Ambulatory Visit: Payer: No Typology Code available for payment source | Admitting: Family Medicine

## 2015-08-05 ENCOUNTER — Ambulatory Visit: Payer: No Typology Code available for payment source

## 2015-08-11 ENCOUNTER — Telehealth: Payer: Self-pay | Admitting: Internal Medicine

## 2015-08-11 NOTE — Telephone Encounter (Signed)
Call to patient to confirm appointment for 08/11/14 at 10:30 lmtcb

## 2015-08-12 ENCOUNTER — Ambulatory Visit: Payer: No Typology Code available for payment source

## 2015-08-18 ENCOUNTER — Telehealth: Payer: Self-pay | Admitting: Internal Medicine

## 2015-08-18 NOTE — Telephone Encounter (Signed)
Call to patient to confirm appointment for 08/19/15 at 11:00 lmtcb

## 2015-08-19 ENCOUNTER — Ambulatory Visit: Payer: No Typology Code available for payment source

## 2016-05-02 ENCOUNTER — Telehealth: Payer: Self-pay | Admitting: General Practice

## 2016-05-02 NOTE — Telephone Encounter (Signed)
APT. REMINDER CALL, NO ANSWER, NO VOICEMAIL °

## 2016-05-03 ENCOUNTER — Ambulatory Visit: Payer: No Typology Code available for payment source

## 2016-05-09 ENCOUNTER — Telehealth: Payer: Self-pay

## 2016-05-09 NOTE — Telephone Encounter (Signed)
APT. REMINDER CALL, NO ANSWER, NO VOICEMAIL °

## 2016-05-10 ENCOUNTER — Ambulatory Visit: Payer: No Typology Code available for payment source

## 2016-09-28 ENCOUNTER — Encounter (HOSPITAL_COMMUNITY): Admission: EM | Disposition: A | Discharge status: Home / Self Care | Payer: Self-pay | Source: Home / Self Care

## 2016-09-28 ENCOUNTER — Observation Stay (HOSPITAL_COMMUNITY): Payer: Self-pay | Admitting: Certified Registered Nurse Anesthetist

## 2016-09-28 ENCOUNTER — Encounter (HOSPITAL_COMMUNITY): Payer: Self-pay | Admitting: Emergency Medicine

## 2016-09-28 ENCOUNTER — Emergency Department (HOSPITAL_COMMUNITY): Payer: Self-pay

## 2016-09-28 ENCOUNTER — Inpatient Hospital Stay (HOSPITAL_COMMUNITY)
Admission: EM | Admit: 2016-09-28 | Discharge: 2016-09-30 | DRG: 354 | Disposition: A | Discharge status: Home / Self Care | Payer: Self-pay | Attending: Surgery | Admitting: Surgery

## 2016-09-28 DIAGNOSIS — Z8249 Family history of ischemic heart disease and other diseases of the circulatory system: Secondary | ICD-10-CM

## 2016-09-28 DIAGNOSIS — R109 Unspecified abdominal pain: Secondary | ICD-10-CM

## 2016-09-28 DIAGNOSIS — Z825 Family history of asthma and other chronic lower respiratory diseases: Secondary | ICD-10-CM

## 2016-09-28 DIAGNOSIS — Z9851 Tubal ligation status: Secondary | ICD-10-CM

## 2016-09-28 DIAGNOSIS — E860 Dehydration: Secondary | ICD-10-CM | POA: Diagnosis present

## 2016-09-28 DIAGNOSIS — E871 Hypo-osmolality and hyponatremia: Secondary | ICD-10-CM | POA: Diagnosis present

## 2016-09-28 DIAGNOSIS — N179 Acute kidney failure, unspecified: Secondary | ICD-10-CM | POA: Diagnosis present

## 2016-09-28 DIAGNOSIS — K56609 Unspecified intestinal obstruction, unspecified as to partial versus complete obstruction: Secondary | ICD-10-CM

## 2016-09-28 DIAGNOSIS — E872 Acidosis: Secondary | ICD-10-CM | POA: Diagnosis present

## 2016-09-28 DIAGNOSIS — K436 Other and unspecified ventral hernia with obstruction, without gangrene: Principal | ICD-10-CM | POA: Diagnosis present

## 2016-09-28 DIAGNOSIS — Z87891 Personal history of nicotine dependence: Secondary | ICD-10-CM

## 2016-09-28 DIAGNOSIS — Z9049 Acquired absence of other specified parts of digestive tract: Secondary | ICD-10-CM

## 2016-09-28 DIAGNOSIS — E876 Hypokalemia: Secondary | ICD-10-CM | POA: Diagnosis present

## 2016-09-28 DIAGNOSIS — K5900 Constipation, unspecified: Secondary | ICD-10-CM | POA: Diagnosis present

## 2016-09-28 HISTORY — PX: VENTRAL HERNIA REPAIR: SHX424

## 2016-09-28 LAB — URINALYSIS, ROUTINE W REFLEX MICROSCOPIC
Bilirubin Urine: NEGATIVE
GLUCOSE, UA: NEGATIVE mg/dL
Ketones, ur: NEGATIVE mg/dL
LEUKOCYTES UA: NEGATIVE
NITRITE: NEGATIVE
PROTEIN: NEGATIVE mg/dL
SPECIFIC GRAVITY, URINE: 1.016 (ref 1.005–1.030)
pH: 5 (ref 5.0–8.0)

## 2016-09-28 LAB — DIFFERENTIAL
BASOS ABS: 0 10*3/uL (ref 0.0–0.1)
Basophils Relative: 0 %
Eosinophils Absolute: 0 10*3/uL (ref 0.0–0.7)
Eosinophils Relative: 0 %
LYMPHS ABS: 1.7 10*3/uL (ref 0.7–4.0)
Lymphocytes Relative: 10 %
MONO ABS: 1 10*3/uL (ref 0.1–1.0)
MONOS PCT: 6 %
NEUTROS PCT: 84 %
Neutro Abs: 13.8 10*3/uL — ABNORMAL HIGH (ref 1.7–7.7)

## 2016-09-28 LAB — CBC
HEMATOCRIT: 51.1 % — AB (ref 36.0–46.0)
Hemoglobin: 19 g/dL — ABNORMAL HIGH (ref 12.0–15.0)
MCH: 29.5 pg (ref 26.0–34.0)
MCHC: 37.2 g/dL — AB (ref 30.0–36.0)
MCV: 79.5 fL (ref 78.0–100.0)
PLATELETS: 289 10*3/uL (ref 150–400)
RBC: 6.43 MIL/uL — ABNORMAL HIGH (ref 3.87–5.11)
RDW: 13.7 % (ref 11.5–15.5)
WBC: 16.5 10*3/uL — AB (ref 4.0–10.5)

## 2016-09-28 LAB — MAGNESIUM: MAGNESIUM: 2.1 mg/dL (ref 1.7–2.4)

## 2016-09-28 LAB — I-STAT CG4 LACTIC ACID, ED
LACTIC ACID, VENOUS: 6.22 mmol/L — AB (ref 0.5–1.9)
Lactic Acid, Venous: 1.71 mmol/L (ref 0.5–1.9)

## 2016-09-28 LAB — LIPASE, BLOOD: Lipase: 38 U/L (ref 11–51)

## 2016-09-28 SURGERY — REPAIR, HERNIA, VENTRAL
Anesthesia: General

## 2016-09-28 MED ORDER — HYDROCODONE-ACETAMINOPHEN 5-325 MG PO TABS
1.0000 | ORAL_TABLET | ORAL | Status: DC | PRN
  Administered 2016-09-29 – 2016-09-30 (×2): 1 via ORAL
  Filled 2016-09-28 (×2): qty 1

## 2016-09-28 MED ORDER — MORPHINE SULFATE (PF) 4 MG/ML IV SOLN: 1.0000 mg | INTRAVENOUS | Status: DC | PRN

## 2016-09-28 MED ORDER — HYDROMORPHONE HCL 1 MG/ML IJ SOLN
INTRAMUSCULAR | Status: AC
  Filled 2016-09-28: qty 1

## 2016-09-28 MED ORDER — POTASSIUM CHLORIDE 10 MEQ/100ML IV SOLN
10.0000 meq | INTRAVENOUS | Status: AC
  Administered 2016-09-28 (×3): 10 meq via INTRAVENOUS
  Filled 2016-09-28 (×3): qty 100

## 2016-09-28 MED ORDER — DEXTROSE 5 % IV SOLN
2.0000 g | INTRAVENOUS | Status: AC
  Administered 2016-09-28: 2 g via INTRAVENOUS
  Filled 2016-09-28: qty 2

## 2016-09-28 MED ORDER — PROPOFOL 10 MG/ML IV BOLUS
INTRAVENOUS | Status: DC | PRN
  Administered 2016-09-28: 140 mg via INTRAVENOUS

## 2016-09-28 MED ORDER — ONDANSETRON HCL 4 MG/2ML IJ SOLN
INTRAMUSCULAR | Status: AC
  Filled 2016-09-28: qty 2

## 2016-09-28 MED ORDER — LIDOCAINE 2% (20 MG/ML) 5 ML SYRINGE
INTRAMUSCULAR | Status: AC
  Filled 2016-09-28: qty 5

## 2016-09-28 MED ORDER — PROPOFOL 10 MG/ML IV BOLUS
INTRAVENOUS | Status: AC
  Filled 2016-09-28: qty 20

## 2016-09-28 MED ORDER — MIDAZOLAM HCL 2 MG/2ML IJ SOLN
INTRAMUSCULAR | Status: AC
  Filled 2016-09-28: qty 2

## 2016-09-28 MED ORDER — ONDANSETRON HCL 4 MG/2ML IJ SOLN: 4.0000 mg | Freq: Four times a day (QID) | INTRAMUSCULAR | Status: DC | PRN

## 2016-09-28 MED ORDER — HYDROMORPHONE HCL 1 MG/ML IJ SOLN
0.2500 mg | INTRAMUSCULAR | Status: DC | PRN
  Administered 2016-09-28 (×2): 0.5 mg via INTRAVENOUS

## 2016-09-28 MED ORDER — FENTANYL CITRATE (PF) 250 MCG/5ML IJ SOLN
INTRAMUSCULAR | Status: AC
  Filled 2016-09-28: qty 5

## 2016-09-28 MED ORDER — MONTELUKAST SODIUM 10 MG PO TABS: 10.0000 mg | ORAL_TABLET | Freq: Every day | ORAL | Status: DC

## 2016-09-28 MED ORDER — SODIUM CHLORIDE 0.9 % IV SOLN
30.0000 meq | Freq: Once | INTRAVENOUS | Status: DC
  Filled 2016-09-28: qty 15

## 2016-09-28 MED ORDER — MIDAZOLAM HCL 5 MG/5ML IJ SOLN
INTRAMUSCULAR | Status: DC | PRN
  Administered 2016-09-28: 2 mg via INTRAVENOUS

## 2016-09-28 MED ORDER — DIPHENHYDRAMINE HCL 50 MG/ML IJ SOLN: 25.0000 mg | Freq: Four times a day (QID) | INTRAMUSCULAR | Status: DC | PRN

## 2016-09-28 MED ORDER — CEFOTETAN DISODIUM-DEXTROSE 2-2.08 GM-% IV SOLR
INTRAVENOUS | Status: AC
  Filled 2016-09-28: qty 50

## 2016-09-28 MED ORDER — PANTOPRAZOLE SODIUM 40 MG IV SOLR
40.0000 mg | Freq: Every day | INTRAVENOUS | Status: DC
  Administered 2016-09-28 – 2016-09-29 (×2): 40 mg via INTRAVENOUS
  Filled 2016-09-28 (×2): qty 40

## 2016-09-28 MED ORDER — ONDANSETRON HCL 4 MG/2ML IJ SOLN
INTRAMUSCULAR | Status: DC | PRN
  Administered 2016-09-28: 4 mg via INTRAVENOUS

## 2016-09-28 MED ORDER — SUGAMMADEX SODIUM 200 MG/2ML IV SOLN
INTRAVENOUS | Status: DC | PRN
  Administered 2016-09-28: 200 mg via INTRAVENOUS

## 2016-09-28 MED ORDER — ONDANSETRON HCL 4 MG/2ML IJ SOLN
4.0000 mg | Freq: Once | INTRAMUSCULAR | Status: AC
  Administered 2016-09-28: 4 mg via INTRAVENOUS
  Filled 2016-09-28: qty 2

## 2016-09-28 MED ORDER — DIPHENHYDRAMINE HCL 25 MG PO CAPS: 25.0000 mg | ORAL_CAPSULE | Freq: Four times a day (QID) | ORAL | Status: DC | PRN

## 2016-09-28 MED ORDER — SUCCINYLCHOLINE CHLORIDE 200 MG/10ML IV SOSY
PREFILLED_SYRINGE | INTRAVENOUS | Status: DC | PRN
  Administered 2016-09-28: 120 mg via INTRAVENOUS

## 2016-09-28 MED ORDER — ROCURONIUM BROMIDE 50 MG/5ML IV SOSY
PREFILLED_SYRINGE | INTRAVENOUS | Status: DC | PRN
  Administered 2016-09-28: 40 mg via INTRAVENOUS

## 2016-09-28 MED ORDER — CHLORHEXIDINE GLUCONATE CLOTH 2 % EX PADS: 6.0000 | MEDICATED_PAD | Freq: Once | CUTANEOUS | Status: DC

## 2016-09-28 MED ORDER — SUCCINYLCHOLINE CHLORIDE 200 MG/10ML IV SOSY
PREFILLED_SYRINGE | INTRAVENOUS | Status: AC
  Filled 2016-09-28: qty 10

## 2016-09-28 MED ORDER — ONDANSETRON 4 MG PO TBDP: 4.0000 mg | ORAL_TABLET | Freq: Four times a day (QID) | ORAL | Status: DC | PRN

## 2016-09-28 MED ORDER — PROMETHAZINE HCL 25 MG/ML IJ SOLN: 6.2500 mg | INTRAMUSCULAR | Status: DC | PRN

## 2016-09-28 MED ORDER — KCL IN DEXTROSE-NACL 20-5-0.9 MEQ/L-%-% IV SOLN
INTRAVENOUS | Status: DC
  Filled 2016-09-28: qty 1000

## 2016-09-28 MED ORDER — SODIUM CHLORIDE 0.9 % IV BOLUS (SEPSIS)
1000.0000 mL | Freq: Once | INTRAVENOUS | Status: AC
  Administered 2016-09-28: 1000 mL via INTRAVENOUS

## 2016-09-28 MED ORDER — BUPIVACAINE HCL (PF) 0.25 % IJ SOLN
INTRAMUSCULAR | Status: AC
  Filled 2016-09-28: qty 30

## 2016-09-28 MED ORDER — FENTANYL CITRATE (PF) 100 MCG/2ML IJ SOLN
INTRAMUSCULAR | Status: DC | PRN
  Administered 2016-09-28 (×5): 50 ug via INTRAVENOUS

## 2016-09-28 MED ORDER — LACTATED RINGERS IV SOLN
INTRAVENOUS | Status: DC | PRN
  Administered 2016-09-28: 10:00:00 via INTRAVENOUS

## 2016-09-28 MED ORDER — TRAZODONE HCL 50 MG PO TABS: 50.0000 mg | ORAL_TABLET | Freq: Every day | ORAL | Status: DC

## 2016-09-28 MED ORDER — SUGAMMADEX SODIUM 200 MG/2ML IV SOLN
INTRAVENOUS | Status: AC
  Filled 2016-09-28: qty 2

## 2016-09-28 MED ORDER — FLUOXETINE HCL 20 MG PO CAPS: 20.0000 mg | ORAL_CAPSULE | Freq: Every day | ORAL | Status: DC

## 2016-09-28 MED ORDER — MORPHINE SULFATE (PF) 4 MG/ML IV SOLN
4.0000 mg | Freq: Once | INTRAVENOUS | Status: AC
  Administered 2016-09-28: 4 mg via INTRAVENOUS
  Filled 2016-09-28: qty 1

## 2016-09-28 MED ORDER — SODIUM CHLORIDE 0.9 % IV SOLN
INTRAVENOUS | Status: DC | PRN
  Administered 2016-09-28: 09:00:00 via INTRAVENOUS

## 2016-09-28 MED ORDER — DEXAMETHASONE SODIUM PHOSPHATE 10 MG/ML IJ SOLN
INTRAMUSCULAR | Status: AC
  Filled 2016-09-28: qty 1

## 2016-09-28 MED ORDER — KCL IN DEXTROSE-NACL 20-5-0.9 MEQ/L-%-% IV SOLN
INTRAVENOUS | Status: DC
  Administered 2016-09-28 (×2): via INTRAVENOUS
  Administered 2016-09-29: 100 mL/h via INTRAVENOUS
  Administered 2016-09-29: 13:00:00 via INTRAVENOUS
  Filled 2016-09-28 (×5): qty 1000

## 2016-09-28 MED ORDER — ROCURONIUM BROMIDE 50 MG/5ML IV SOSY
PREFILLED_SYRINGE | INTRAVENOUS | Status: AC
  Filled 2016-09-28: qty 5

## 2016-09-28 MED ORDER — SODIUM CHLORIDE 0.9 % IV SOLN
Freq: Once | INTRAVENOUS | Status: AC
  Administered 2016-09-28: 07:00:00 via INTRAVENOUS

## 2016-09-28 MED ORDER — HYDRALAZINE HCL 20 MG/ML IJ SOLN: 10.0000 mg | INTRAMUSCULAR | Status: DC | PRN

## 2016-09-28 MED ORDER — LACTATED RINGERS IV SOLN
INTRAVENOUS | Status: DC
  Administered 2016-09-28 (×2): via INTRAVENOUS

## 2016-09-28 MED ORDER — LIDOCAINE 2% (20 MG/ML) 5 ML SYRINGE
INTRAMUSCULAR | Status: DC | PRN
  Administered 2016-09-28: 100 mg via INTRAVENOUS

## 2016-09-28 MED ORDER — BUPIVACAINE HCL (PF) 0.25 % IJ SOLN
INTRAMUSCULAR | Status: DC | PRN
  Administered 2016-09-28: 10 mL

## 2016-09-28 MED ORDER — DEXAMETHASONE SODIUM PHOSPHATE 10 MG/ML IJ SOLN
INTRAMUSCULAR | Status: DC | PRN
  Administered 2016-09-28: 10 mg via INTRAVENOUS

## 2016-09-28 MED ORDER — HEPARIN SODIUM (PORCINE) 5000 UNIT/ML IJ SOLN
5000.0000 [IU] | Freq: Three times a day (TID) | INTRAMUSCULAR | Status: DC
  Administered 2016-09-29 – 2016-09-30 (×4): 5000 [IU] via SUBCUTANEOUS
  Filled 2016-09-28 (×4): qty 1

## 2016-09-28 SURGICAL SUPPLY — 38 items
ADH SKN CLS APL DERMABOND .7 (GAUZE/BANDAGES/DRESSINGS) ×1
BINDER ABDOMINAL 12 ML 46-62 (SOFTGOODS) IMPLANT
BLADE HEX COATED 2.75 (ELECTRODE) ×3 IMPLANT
COVER SURGICAL LIGHT HANDLE (MISCELLANEOUS) ×3 IMPLANT
DECANTER SPIKE VIAL GLASS SM (MISCELLANEOUS) IMPLANT
DERMABOND ADVANCED (GAUZE/BANDAGES/DRESSINGS) ×2
DERMABOND ADVANCED .7 DNX12 (GAUZE/BANDAGES/DRESSINGS) ×1 IMPLANT
DRAIN CHANNEL RND F F (WOUND CARE) IMPLANT
DRAPE LAPAROSCOPIC ABDOMINAL (DRAPES) ×3 IMPLANT
ELECT REM PT RETURN 9FT ADLT (ELECTROSURGICAL) ×3
ELECTRODE REM PT RTRN 9FT ADLT (ELECTROSURGICAL) ×1 IMPLANT
EVACUATOR SILICONE 100CC (DRAIN) IMPLANT
GAUZE SPONGE 4X4 12PLY STRL (GAUZE/BANDAGES/DRESSINGS) ×3 IMPLANT
GLOVE BIO SURGEON STRL SZ7.5 (GLOVE) ×6 IMPLANT
GLOVE BIOGEL PI IND STRL 7.0 (GLOVE) ×1 IMPLANT
GLOVE BIOGEL PI INDICATOR 7.0 (GLOVE) ×2
GOWN STRL REUS W/ TWL XL LVL3 (GOWN DISPOSABLE) ×1 IMPLANT
GOWN STRL REUS W/TWL LRG LVL3 (GOWN DISPOSABLE) ×3 IMPLANT
GOWN STRL REUS W/TWL XL LVL3 (GOWN DISPOSABLE) ×5 IMPLANT
KIT BASIN OR (CUSTOM PROCEDURE TRAY) ×3 IMPLANT
NEEDLE HYPO 25X1 1.5 SAFETY (NEEDLE) IMPLANT
NS IRRIG 1000ML POUR BTL (IV SOLUTION) ×3 IMPLANT
PACK GENERAL/GYN (CUSTOM PROCEDURE TRAY) ×3 IMPLANT
STAPLER VISISTAT 35W (STAPLE) IMPLANT
SUT ETHILON 3 0 PS 1 (SUTURE) IMPLANT
SUT MNCRL AB 4-0 PS2 18 (SUTURE) ×3 IMPLANT
SUT NOVA 1 T20/GS 25DT (SUTURE) ×3 IMPLANT
SUT PDS AB 1 CTX 36 (SUTURE) IMPLANT
SUT PROLENE 0 CT 1 CR/8 (SUTURE) IMPLANT
SUT SILK 3 0 (SUTURE)
SUT SILK 3-0 18XBRD TIE 12 (SUTURE) IMPLANT
SUT VIC AB 2-0 CT1 27 (SUTURE) ×3
SUT VIC AB 2-0 CT1 27XBRD (SUTURE) ×1 IMPLANT
SUT VIC AB 3-0 SH 27 (SUTURE)
SUT VIC AB 3-0 SH 27XBRD (SUTURE) IMPLANT
SYR CONTROL 10ML LL (SYRINGE) IMPLANT
TOWEL OR 17X26 10 PK STRL BLUE (TOWEL DISPOSABLE) ×3 IMPLANT
TRAY FOLEY CATH SILVER 14FR (SET/KITS/TRAYS/PACK) ×3 IMPLANT

## 2016-09-28 NOTE — Anesthesia Preprocedure Evaluation (Signed)
Anesthesia Evaluation  Patient identified by MRN, date of birth, ID band Patient awake    Reviewed: Allergy & Precautions, NPO status , Patient's Chart, lab work & pertinent test results  Airway Mallampati: II  TM Distance: >3 FB Neck ROM: Full    Dental no notable dental hx.    Pulmonary neg pulmonary ROS, former smoker,    Pulmonary exam normal breath sounds clear to auscultation       Cardiovascular negative cardio ROS Normal cardiovascular exam Rhythm:Regular Rate:Normal     Neuro/Psych negative neurological ROS  negative psych ROS   GI/Hepatic negative GI ROS, Neg liver ROS,   Endo/Other  negative endocrine ROS  Renal/GU negative Renal ROS  negative genitourinary   Musculoskeletal negative musculoskeletal ROS (+)   Abdominal   Peds negative pediatric ROS (+)  Hematology negative hematology ROS (+)   Anesthesia Other Findings   Reproductive/Obstetrics negative OB ROS                             Anesthesia Physical Anesthesia Plan  ASA: II and emergent  Anesthesia Plan: General   Post-op Pain Management:    Induction: Intravenous and Rapid sequence  Airway Management Planned: Oral ETT  Additional Equipment:   Intra-op Plan:   Post-operative Plan: Extubation in OR  Informed Consent: I have reviewed the patients History and Physical, chart, labs and discussed the procedure including the risks, benefits and alternatives for the proposed anesthesia with the patient or authorized representative who has indicated his/her understanding and acceptance.   Dental advisory given  Plan Discussed with: CRNA and Surgeon  Anesthesia Plan Comments:         Anesthesia Quick Evaluation

## 2016-09-28 NOTE — ED Notes (Signed)
OR notified patient ready for transport.

## 2016-09-28 NOTE — ED Provider Notes (Signed)
51 year old female comes in with five-day history of vomiting and abdominal pain. She's not been a hold anything down during this time. Abdominal pain is located in the epigastric area and right upper quadrant. She presented to the ED and significant amount of pain, but is feeling somewhat better following 4 mg morphine and 4 mg ondansetron. She is noted to have marked last tic acidosis with lactic acid level is 6.22, and acute renal failure with creatinine of 3.34. BUN is pending at this time and is presumably very high. Last creatinine on record was from 07/06/2013 and was 0.88. She also has metabolic acidosis with CO2 of 18, hypokalemia with potassium 2.8, and elevated anion gap of 26. CBC is still pending. Current labs are consistent with dehydration. She is being given aggressive fluid replacement, and CT of abdomen and pelvis is pending. She will clearly need to be admitted to monitor her renal function.  Medical screening examination/treatment/procedure(s) were conducted as a shared visit with non-physician practitioner(s) and myself.  I personally evaluated the patient during the encounter.   EKG Interpretation  Date/Time:  Friday September 28 2016 05:12:46 EST Ventricular Rate:  99 PR Interval:    QRS Duration: 82 QT Interval:  371 QTC Calculation: 477 R Axis:   93 Text Interpretation:  Sinus rhythm Consider right atrial enlargement Borderline right axis deviation Borderline ST depression, inferior leads When compared with ECG of 05/01/2007, No significant change was found Confirmed by [facility]  MD, Kyheem Bathgate ([id number]) on 09/28/2016 5:19:07 AM        Delora Fuel, MD 20/10/07 1219

## 2016-09-28 NOTE — Anesthesia Procedure Notes (Signed)
Procedure Name: Intubation Date/Time: 09/28/2016 9:52 AM Performed by: Maxwell Caul Pre-anesthesia Checklist: Patient identified, Emergency Drugs available, Suction available and Patient being monitored Patient Re-evaluated:Patient Re-evaluated prior to inductionOxygen Delivery Method: Circle system utilized Preoxygenation: Pre-oxygenation with 100% oxygen Intubation Type: IV induction, Rapid sequence and Cricoid Pressure applied Laryngoscope Size: Mac and 4 Grade View: Grade I Tube type: Oral Tube size: 7.5 mm Number of attempts: 1 Airway Equipment and Method: Stylet Placement Confirmation: ETT inserted through vocal cords under direct vision,  positive ETCO2 and breath sounds checked- equal and bilateral Secured at: 21 cm Tube secured with: Tape Dental Injury: Teeth and Oropharynx as per pre-operative assessment

## 2016-09-28 NOTE — ED Notes (Signed)
ED Provider at bedside. 

## 2016-09-28 NOTE — ED Notes (Signed)
Pharmacy notified to send potassium, states will send as soon as possible.

## 2016-09-28 NOTE — Anesthesia Postprocedure Evaluation (Signed)
Anesthesia Post Note  Patient: Estephany Perot  Procedure(s) Performed: Procedure(s) (LRB): HERNIA REPAIR VENTRAL ADULT (N/A)  Patient location during evaluation: PACU Anesthesia Type: General Level of consciousness: awake and alert Pain management: pain level controlled Vital Signs Assessment: post-procedure vital signs reviewed and stable Respiratory status: spontaneous breathing, nonlabored ventilation, respiratory function stable and patient connected to nasal cannula oxygen Cardiovascular status: blood pressure returned to baseline and stable Postop Assessment: no signs of nausea or vomiting Anesthetic complications: no       Last Vitals:  Vitals:   09/28/16 1130 09/28/16 1145  BP: 136/81 123/77  Pulse: 76 81  Resp: 20 19  Temp:      Last Pain:  Vitals:   09/28/16 1130  TempSrc:   PainSc: Asleep                 Glynna Failla S

## 2016-09-28 NOTE — Op Note (Signed)
09/28/2016  11:15 AM  PATIENT:  Heidi Galvan  51 y.o. female  PRE-OPERATIVE DIAGNOSIS:  incarcerated ventral hernia  POST-OPERATIVE DIAGNOSIS:  incarcerated ventral hernia  PROCEDURE:  Procedure(s): HERNIA REPAIR VENTRAL ADULT (N/A)  SURGEON:  Surgeon(s) and Role:    * Jovita Kussmaul, MD - Primary  PHYSICIAN ASSISTANT:   ASSISTANTS: none   ANESTHESIA:   local and general  EBL:  Total I/O In: 2750 [I.V.:2750] Out: 260 [Urine:10; Blood:250]  BLOOD ADMINISTERED:none  DRAINS: none   LOCAL MEDICATIONS USED:  MARCAINE     SPECIMEN:  Source of Specimen:  hernia sac  DISPOSITION OF SPECIMEN:  PATHOLOGY  COUNTS:  YES  TOURNIQUET:  * No tourniquets in log *  DICTATION: .Dragon Dictation   After informed consent was obtained the patient was brought to the operating room and placed in the supine position on the operating room table. After adequate induction of general anesthesia the patient's abdomen was prepped with ChloraPrep, allowed to dry, and draped in usual sterile manner. An appropriate timeout was performed. The area above the umbilicus was infiltrated with quarter percent Marcaine. A small vertically oriented incision was made just above the umbilicus with a 10 blade knife. The incision was carried through the skin and subcutaneous tissue sharply with electrocautery until the hernia sac and fascia of the abdominal wall was encountered. The bowel within the hernia had appeared to have spontaneously reduced. The hernia sac was opened sharply with Metzenbaum scissors and there were no visceral contents within the sac. The bowel that we could see had a very normal appearance. The hernia sac was excised sharply with electrocautery. The actual fascial defect was too small to allow an index finger through. Because of this I elected to simply repair the hernia with #1 Novafil stitches. The wound was then irrigated with copious amounts of saline. The subcutaneous tissue was  reapproximated with interrupted 2-0 Vicryl stitches. The skin was then closed with a running 4-0 Monocryl subcuticular stitch. Dermabond dressings were applied. The patient tolerated the procedure well. At the end of the case all needle sponge and a stomach counts were correct. The patient was then awakened and taken to recovery in stable condition.  PLAN OF CARE: Admit to inpatient   PATIENT DISPOSITION:  PACU - hemodynamically stable.   Delay start of Pharmacological VTE agent (>24hrs) due to surgical blood loss or risk of bleeding: no

## 2016-09-28 NOTE — ED Provider Notes (Signed)
[facility] DEPT Provider Note   CSN: [id number] Arrival date & time: 09/28/16  0428     History   Chief Complaint Chief Complaint  Patient presents with  . Abdominal Pain  . Emesis    HPI Heidi Galvan is a 51 y.o. female.  HPI Heidi Galvan is a 51 y.o. female presents to emergency department complaining of abdominal pain. Patient reports 5 day history of abdominal pain, nausea, vomiting. States initially thought she may have had food poisoning. States symptoms continued to worsen. She denies any diarrhea. Last bowel movement was 5 days ago and was normal. Patient states she starting up yellow liquid. Denies any blood in her emesis. Denies any fever or chills. No urinary symptoms. States pain is in the right upper quadrant and radiates into the right back and right shoulder. Denies history of similar pain in the past. No medications taken prior to coming in for her symptoms.  Past Medical History:  Diagnosis Date  . Depression   . Iron deficiency anemia   . Menometrorrhagia   . Migraine     Patient Active Problem List   Diagnosis Date Noted  . Left knee pain 02/23/2015  . Viral URI with cough 10/19/2013  . Knee joint pain 07/06/2013  . Tooth abscess 12/28/2011  . Mole (skin) 01/27/2011  . EXCESSIVE MENSTRUAL BLEEDING 05/23/2010  . IRON DEFICIENCY ANEMIA, HX OF 05/23/2010  . DEPRESSION 04/25/2010  . MIGRAINE UNSP W/O INTRACT W/O STATUS MIGRAINOSUS 04/25/2010    Past Surgical History:  Procedure Laterality Date  . CHOLECYSTECTOMY    . DILATION AND CURETTAGE OF UTERUS  2012    OB History    Gravida Para Term Preterm AB Living   3 3 3  0 0 3   SAB TAB Ectopic Multiple Live Births   0 0 0 0         Home Medications    Prior to Admission medications   Medication Sig Start Date End Date Taking? Authorizing Provider  albuterol (PROVENTIL HFA;VENTOLIN HFA) 108 (90 BASE) MCG/ACT inhaler Inhale 2 puffs into the lungs every 6 (six) hours as needed for  wheezing or shortness of breath. 04/04/15   Norman Herrlich, MD  ferrous sulfate 325 (65 FE) MG tablet Take 1 tablet (325 mg total) by mouth 3 (three) times daily with meals. 04/16/12 07/07/13  Ansel Bong, MD  FLUoxetine (PROZAC) 20 MG capsule Take 1 capsule (20 mg total) by mouth daily. 11/09/11 07/07/13  Heinz Knuckles, MD  fluticasone (FLONASE) 50 MCG/ACT nasal spray Place 1 spray into the nose daily. 08/19/12   Ivor Costa, MD  guaiFENesin (MUCINEX) 600 MG 12 hr tablet Take 1 tablet (600 mg total) by mouth 2 (two) times daily as needed. 04/04/15   Norman Herrlich, MD  meloxicam (MOBIC) 15 MG tablet Take 1 tablet (15 mg total) by mouth daily. 06/15/15   Bryan R Hess, DO  montelukast (SINGULAIR) 10 MG tablet Take 1 tablet (10 mg total) by mouth daily. 06/02/13 06/02/14  Hoyt Koch, MD  phenol (CHLORASEPTIC) 1.4 % LIQD Use as directed 1 spray in the mouth or throat as needed for throat irritation / pain. 04/04/15   Norman Herrlich, MD  sodium chloride (OCEAN NASAL SPRAY) 0.65 % nasal spray Place 1 spray into the nose as needed for congestion. 05/19/12 05/19/13  Dominic Pea, DO  traZODone (DESYREL) 100 MG tablet Take 0.5 tablets (50 mg total) by mouth at bedtime. 11/12/11 12/12/11  Heinz Knuckles, MD  Family History Family History  Problem Relation Age of Onset  . Asthma Mother   . Hypertension Mother   . Asthma Daughter   . Asthma Son     Social History Social History  Substance Use Topics  . Smoking status: Former Smoker    Quit date: 09/12/1981  . Smokeless tobacco: Never Used  . Alcohol use No     Allergies   Cholestatin and Penicillins   Review of Systems Review of Systems  Constitutional: Negative for chills and fever.  Respiratory: Negative for cough, chest tightness and shortness of breath.   Cardiovascular: Negative for chest pain, palpitations and leg swelling.  Gastrointestinal: Positive for abdominal pain, nausea and vomiting. Negative for diarrhea.    Genitourinary: Positive for flank pain. Negative for dysuria and pelvic pain.  Musculoskeletal: Negative for arthralgias, myalgias, neck pain and neck stiffness.  Skin: Negative for rash.  Neurological: Negative for dizziness, weakness and headaches.  All other systems reviewed and are negative.    Physical Exam Updated Vital Signs BP (!) 153/114 (BP Location: Right Arm)   Pulse 102   Temp 97.5 F (36.4 C) (Oral)   Ht 5' (1.524 m)   Wt 64.9 kg   SpO2 95%   BMI 27.93 kg/m   Physical Exam  Constitutional: She is oriented to person, place, and time. She appears well-developed and well-nourished.  Appears to be in pain.  HENT:  Head: Normocephalic.  Eyes: Conjunctivae are normal.  Neck: Neck supple.  Cardiovascular: Normal rate, regular rhythm and normal heart sounds.   Pulmonary/Chest: Effort normal and breath sounds normal. No respiratory distress. She has no wheezes. She has no rales.  Abdominal: Soft. She exhibits no distension. There is tenderness. There is no rebound and no guarding.  Right upper quadrant tenderness. No CVA tenderness. Decreased bowel sounds.   Musculoskeletal: She exhibits no edema.  Neurological: She is alert and oriented to person, place, and time.  Skin: Skin is warm and dry.  Psychiatric: She has a normal mood and affect. Her behavior is normal.  Nursing note and vitals reviewed.    ED Treatments / Results  Labs (all labs ordered are listed, but only abnormal results are displayed) Labs Reviewed  COMPREHENSIVE METABOLIC PANEL - Abnormal; Notable for the following:       Result Value   Sodium 127 (*)    Potassium 2.8 (*)    Chloride 83 (*)    CO2 18 (*)    Glucose, Bld 269 (*)    BUN 89 (*)    Creatinine, Ser 3.34 (*)    Total Protein 9.8 (*)    Albumin 5.4 (*)    AST 49 (*)    Total Bilirubin 1.9 (*)    GFR calc non Af Amer 15 (*)    GFR calc Af Amer 17 (*)    Anion gap 26 (*)    All other components within normal limits  CBC -  Abnormal; Notable for the following:    WBC 16.5 (*)    RBC 6.43 (*)    Hemoglobin 19.0 (*)    HCT 51.1 (*)    MCHC 37.2 (*)    All other components within normal limits  DIFFERENTIAL - Abnormal; Notable for the following:    Neutro Abs 13.8 (*)    All other components within normal limits  I-STAT CG4 LACTIC ACID, ED - Abnormal; Notable for the following:    Lactic Acid, Venous 6.22 (*)    All other components  within normal limits  CULTURE, BLOOD (ROUTINE X 2)  CULTURE, BLOOD (ROUTINE X 2)  LIPASE, BLOOD  MAGNESIUM  URINALYSIS, ROUTINE W REFLEX MICROSCOPIC    EKG  EKG Interpretation  Date/Time:  Friday September 28 2016 05:12:46 EST Ventricular Rate:  99 PR Interval:    QRS Duration: 82 QT Interval:  371 QTC Calculation: 477 R Axis:   93 Text Interpretation:  Sinus rhythm Consider right atrial enlargement Borderline right axis deviation Borderline ST depression, inferior leads When compared with ECG of 05/01/2007, No significant change was found Confirmed by [facility]  MD, DAVID ([id number]) on 09/28/2016 5:19:07 AM Also confirmed by Roxanne Mins  MD, DAVID ([id number]), editor [place]  CCT, BEVERLY ([id number])  on 09/28/2016 6:54:14 AM       Radiology Ct Abdomen Pelvis Wo Contrast  Result Date: 09/28/2016 CLINICAL DATA:  Abdominal pain, nausea and vomiting. EXAM: CT ABDOMEN AND PELVIS WITHOUT CONTRAST TECHNIQUE: Multidetector CT imaging of the abdomen and pelvis was performed following the standard protocol without IV contrast. COMPARISON:  04/02/2013 FINDINGS: Lower chest: No acute findings Hepatobiliary: No focal liver abnormality is seen. Status post cholecystectomy. No biliary dilatation. Pancreas: Unremarkable. No pancreatic ductal dilatation or surrounding inflammatory changes. Spleen: Normal in size without focal abnormality. Adrenals/Urinary Tract: Adrenal glands are unremarkable. Nonobstructing 3 x 5 mm lower pole left collecting system calculus. No significant renal parenchymal lesions. No ureteral  calculi. Urinary bladder is unremarkable. Stomach/Bowel: Hiatal hernia.  Otherwise unremarkable stomach. High-grade small bowel obstruction caused by a midline ventral hernia 2 cm above the umbilicus. Beyond the hernia, ileum is decompressed. Colon is decompressed. Proximal to the hernia, small bowel is distended with air-fluid levels. No extraluminal gas. Vascular/Lymphatic: No significant vascular findings are present. No enlarged abdominal or pelvic lymph nodes. Reproductive: Uterus and bilateral adnexa are unremarkable. Other: No ascites Musculoskeletal: No significant skeletal lesions. IMPRESSION: High-grade small bowel obstruction within a midline ventral hernia 2 cm above the umbilicus. These results were called by telephone at the time of interpretation on 09/28/2016 at 6:19 am to Dr. Jeannett Senior , who verbally acknowledged these results. Incidental findings include nonobstructing left nephrolithiasis and a hiatal hernia. Electronically Signed   By: Andreas Newport M.D.   On: 09/28/2016 06:23    Procedures Procedures (including critical care time)  Medications Ordered in ED Medications  potassium chloride 10 mEq in 100 mL IVPB (10 mEq Intravenous New Bag/Given 09/28/16 3016)  morphine 4 MG/ML injection 4 mg (4 mg Intravenous Given 09/28/16 0512)  ondansetron (ZOFRAN) injection 4 mg (4 mg Intravenous Given 09/28/16 0513)  sodium chloride 0.9 % bolus 1,000 mL (0 mLs Intravenous Stopped 09/28/16 0646)  sodium chloride 0.9 % bolus 1,000 mL (1,000 mLs Intravenous New Bag/Given 09/28/16 0525)  0.9 %  sodium chloride infusion ( Intravenous New Bag/Given 09/28/16 0647)     Initial Impression / Assessment and Plan / ED Course  I have reviewed the triage vital signs and the nursing notes.  Pertinent labs & imaging results that were available during my care of the patient were reviewed by me and considered in my medical decision making (see chart for details).   patient seen and examined. Patient with  progressively worsening abdominal pain over the last 5 days with associated nausea and vomiting. No bowel movement over the last 5 days. Patient appears to be in severe pain during my exam. Tenderness in right upper quadrant, with no guarding or rebound tenderness. Abdomen soft, decreased bowel sounds. Will get blood work including lactic acid, CBC,  CMP, lipase. CT abdomen and pelvis ordered.   5:58 AM Creatinine is 3.34, unable to do CT angio. Will do CT non contrast. Question dehydration. Pt's pain significantly improved with 4mg  of morphine. UA, CT pending.   6:43 AM CT showing high-grade small bowel obstruction with incarcerated ventral hernia. I discussed patient with Dr. Lucia Gaskins with general surgery who will come and see her. NG tube ordered  Vitals:   09/28/16 0436 09/28/16 0521 09/28/16 0530 09/28/16 0607  BP: (!) 153/114 (!) 136/104 (!) 163/101 136/98  Pulse: 102 90 80 84  Resp:  20 16 16   Temp: 97.5 F (36.4 C) 97.8 F (36.6 C)    TempSrc: Oral Oral    SpO2: 95% 92% 94% 94%  Weight: 64.9 kg     Height: 5' (1.524 m)        Final Clinical Impressions(s) / ED Diagnoses   Final diagnoses:  Incarcerated ventral hernia  Small bowel obstruction  Acute kidney injury [facility])    New Prescriptions New Prescriptions   No medications on file     Jeannett Senior, PA-C 56/31/49 [id number]    Delora Fuel, MD 37/85/88 [id number]

## 2016-09-28 NOTE — Interval H&P Note (Signed)
History and Physical Interval Note:  09/28/2016 9:34 AM  Heidi Galvan  has presented today for surgery, with the diagnosis of incarcerated ventral hernia  The various methods of treatment have been discussed with the patient and family. After consideration of risks, benefits and other options for treatment, the patient has consented to  Procedure(s): HERNIA REPAIR VENTRAL ADULT (N/A) as a surgical intervention .  The patient's history has been reviewed, patient examined, no change in status, stable for surgery.  I have reviewed the patient's chart and labs.  Questions were answered to the patient's satisfaction.     TOTH III,PAUL S

## 2016-09-28 NOTE — ED Notes (Signed)
Attempted NG tube placement x 2.  Sue Lush, RN x 1.  NG tube placement unsuccessful.  Surgical PA made aware.

## 2016-09-28 NOTE — ED Notes (Signed)
Pt on her way to OR unable to collect labs at this time

## 2016-09-28 NOTE — ED Notes (Signed)
[facility] at the bedside.

## 2016-09-28 NOTE — ED Triage Notes (Signed)
Pt is c/o abd pain with nausea and vomiting that started 5 days ago  Pt states she has not had a bowel movement since Sunday  Pt states pain has progressively been getting worse  Pt states she got up to get a glass of water tonight and had a syncopal episode tonight

## 2016-09-28 NOTE — ED Notes (Signed)
EKG given to EDP,Glick,MD., for review. 

## 2016-09-28 NOTE — Transfer of Care (Signed)
Immediate Anesthesia Transfer of Care Note  Patient: Heidi Galvan  Procedure(s) Performed: Procedure(s): HERNIA REPAIR VENTRAL ADULT (N/A)  Patient Location: PACU  Anesthesia Type:General  Level of Consciousness:  sedated, patient cooperative and responds to stimulation  Airway & Oxygen Therapy:Patient Spontanous Breathing and Patient connected to face mask oxgen  Post-op Assessment:  Report given to PACU RN and Post -op Vital signs reviewed and stable  Post vital signs:  Reviewed and stable  Last Vitals:  Vitals:   09/28/16 0800 09/28/16 0830  BP: 143/90 142/90  Pulse: 84 82  Resp: 10 17  Temp:      Complications: No apparent anesthesia complications

## 2016-09-28 NOTE — H&P (Signed)
[facility] Admission Note  Heidi Galvan [birth year]  [id number].    Requesting MD: Roxanne Mins Chief Complaint/Reason for Consult: Ventral hernia  HPI:  Heidi Galvan is a 51yo female with no significant PMH who presented to [facility] earlier this morning complaining of 5 days of gradually worsening abdominal pain, abdominal distension, nausea, and vomiting.  States that the pain began during work and has gradually gotten worse. Pain is worse with movement and when she vomits. States that the pain is just the right of her umbilicus. At times she has felt a bulge in this area. States that she had never had pain like this before. Last BM was 5 days ago. Reports no flatus since that time as well. Denies fever, chills, or dysuria. Last meal yesterday afternoon  Hospital workup: - CT scan shows high-grade small bowel obstruction within a midline ventral hernia 2 cm above the umbilicus - WBC 102, lactic acid 6.22 - blood cultures pending   ED attempted multiple times, but has been unsuccessful at placing NG tube. Patient states that pain medication is helping.  No significant PMH Abdominal surgical history includes tubal ligation, lap cholecystectomy 2008 Anticoagulants: none Nonsmoker Employment: housekeeping at hotel  ROS: Review of Systems  Constitutional: Negative.   HENT: Negative.   Eyes: Negative.   Respiratory: Negative.   Cardiovascular: Negative.   Gastrointestinal: Positive for abdominal pain, constipation, nausea and vomiting. Negative for blood in stool, diarrhea, heartburn and melena.  Genitourinary: Negative.   Musculoskeletal: Negative.   Skin: Negative.   Neurological: Negative.     All systems reviewed and otherwise negative except for as above  Family History  Problem Relation Age of Onset  . Asthma Mother   . Hypertension Mother   . Asthma Daughter   . Asthma Son     Past Medical History:  Diagnosis Date  . Depression   . Iron deficiency  anemia   . Menometrorrhagia   . Migraine     Past Surgical History:  Procedure Laterality Date  . CHOLECYSTECTOMY    . DILATION AND CURETTAGE OF UTERUS  2012    Social History:  reports that she quit smoking about 35 years ago. She has never used smokeless tobacco. She reports that she does not drink alcohol or use drugs.  Allergies:  Allergies  Allergen Reactions  . Cholestatin   . Penicillins Rash    Very mild rash on hand and was more than 20 years ago     (Not in a hospital admission)  Prior to Admission medications   Medication Sig Start Date End Date Taking? Authorizing Provider  Cyanocobalamin (VITAMIN B 12 PO) Take 1 tablet by mouth daily.   Yes Historical Provider, MD  FLUoxetine (PROZAC) 20 MG capsule Take 1 capsule (20 mg total) by mouth daily. 11/09/11 07/07/13  Heinz Knuckles, MD  montelukast (SINGULAIR) 10 MG tablet Take 1 tablet (10 mg total) by mouth daily. 06/02/13 06/02/14  Hoyt Koch, MD  sodium chloride (OCEAN NASAL SPRAY) 0.65 % nasal spray Place 1 spray into the nose as needed for congestion. 05/19/12 05/19/13  Dominic Pea, DO  traZODone (DESYREL) 100 MG tablet Take 0.5 tablets (50 mg total) by mouth at bedtime. 11/12/11 12/12/11  Heinz Knuckles, MD    Blood pressure 150/92, pulse 81, temperature 97.8 F (36.6 C), temperature source Oral, resp. rate 16, height 5' (1.524 m), weight 143 lb (64.9 kg), SpO2 91 %. Physical Exam: General: pleasant, WD/WN white female who is laying in bed  in NAD HEENT: head is normocephalic, atraumatic.  Sclera are noninjected.  Mouth is pink and moist Heart: regular, rate, and rhythm.  No obvious murmurs, gallops, or rubs noted.  Palpable pedal pulses bilaterally Lungs: CTAB, no wheezes, rhonchi, or rales noted.  Respiratory effort nonlabored Abd: multiple well healed lap incisions, soft, mild distension, mild tenderness proximal/lateral to umbilicus, +BS, no masses. Known hernia is not palpable. MS: all 4  extremities are symmetrical with no cyanosis, clubbing, or edema. Skin: warm and dry with no masses, lesions, or rashes Psych: A&Ox3 with an appropriate affect. Neuro: CM 2-12 intact, extremity CSM intact bilaterally, normal speech  Results for orders placed or performed during the hospital encounter of 09/28/16 (from the past 48 hour(s))  Lipase, blood     Status: None   Collection Time: 09/28/16  4:51 AM  Result Value Ref Range   Lipase 38 11 - 51 U/L  Comprehensive metabolic panel     Status: Abnormal   Collection Time: 09/28/16  4:51 AM  Result Value Ref Range   Sodium 127 (L) 135 - 145 mmol/L   Potassium 2.8 (L) 3.5 - 5.1 mmol/L   Chloride 83 (L) 101 - 111 mmol/L   CO2 18 (L) 22 - 32 mmol/L   Glucose, Bld 269 (H) 65 - 99 mg/dL   BUN 89 (H) 6 - 20 mg/dL   Creatinine, Ser 0.91 (H) 0.44 - 1.00 mg/dL   Calcium 9.7 8.9 - 06.8 mg/dL   Total Protein 9.8 (H) 6.5 - 8.1 g/dL   Albumin 5.4 (H) 3.5 - 5.0 g/dL   AST 49 (H) 15 - 41 U/L   ALT 45 14 - 54 U/L   Alkaline Phosphatase 107 38 - 126 U/L   Total Bilirubin 1.9 (H) 0.3 - 1.2 mg/dL   GFR calc non Af Amer 15 (L) >60 mL/min   GFR calc Af Amer 17 (L) >60 mL/min    Comment: (NOTE) The eGFR has been calculated using the CKD EPI equation. This calculation has not been validated in all clinical situations. eGFR's persistently <60 mL/min signify possible Chronic Kidney Disease.    Anion gap 26 (H) 5 - 15    Comment: REPEATED TO VERIFY  CBC     Status: Abnormal   Collection Time: 09/28/16  4:51 AM  Result Value Ref Range   WBC 16.5 (H) 4.0 - 10.5 K/uL   RBC 6.43 (H) 3.87 - 5.11 MIL/uL   Hemoglobin 19.0 (H) 12.0 - 15.0 g/dL   HCT 16.6 (H) 19.6 - 94.0 %   MCV 79.5 78.0 - 100.0 fL   MCH 29.5 26.0 - 34.0 pg   MCHC 37.2 (H) 30.0 - 36.0 g/dL    Comment: RULED OUT INTERFERING SUBSTANCES   RDW 13.7 11.5 - 15.5 %   Platelets 289 150 - 400 K/uL  Differential     Status: Abnormal   Collection Time: 09/28/16  4:51 AM  Result Value Ref  Range   Neutrophils Relative % 84 %   Lymphocytes Relative 10 %   Monocytes Relative 6 %   Eosinophils Relative 0 %   Basophils Relative 0 %   Neutro Abs 13.8 (H) 1.7 - 7.7 K/uL   Lymphs Abs 1.7 0.7 - 4.0 K/uL   Monocytes Absolute 1.0 0.1 - 1.0 K/uL   Eosinophils Absolute 0.0 0.0 - 0.7 K/uL   Basophils Absolute 0.0 0.0 - 0.1 K/uL   Smear Review LARGE PLATELETS PRESENT   Magnesium     Status: None  Collection Time: 09/28/16  4:51 AM  Result Value Ref Range   Magnesium 2.1 1.7 - 2.4 mg/dL  I-Stat CG4 Lactic Acid, ED     Status: Abnormal   Collection Time: 09/28/16  5:10 AM  Result Value Ref Range   Lactic Acid, Venous 6.22 (HH) 0.5 - 1.9 mmol/L   Comment NOTIFIED PHYSICIAN    Ct Abdomen Pelvis Wo Contrast  Result Date: 09/28/2016 CLINICAL DATA:  Abdominal pain, nausea and vomiting. EXAM: CT ABDOMEN AND PELVIS WITHOUT CONTRAST TECHNIQUE: Multidetector CT imaging of the abdomen and pelvis was performed following the standard protocol without IV contrast. COMPARISON:  04/02/2013 FINDINGS: Lower chest: No acute findings Hepatobiliary: No focal liver abnormality is seen. Status post cholecystectomy. No biliary dilatation. Pancreas: Unremarkable. No pancreatic ductal dilatation or surrounding inflammatory changes. Spleen: Normal in size without focal abnormality. Adrenals/Urinary Tract: Adrenal glands are unremarkable. Nonobstructing 3 x 5 mm lower pole left collecting system calculus. No significant renal parenchymal lesions. No ureteral calculi. Urinary bladder is unremarkable. Stomach/Bowel: Hiatal hernia.  Otherwise unremarkable stomach. High-grade small bowel obstruction caused by a midline ventral hernia 2 cm above the umbilicus. Beyond the hernia, ileum is decompressed. Colon is decompressed. Proximal to the hernia, small bowel is distended with air-fluid levels. No extraluminal gas. Vascular/Lymphatic: No significant vascular findings are present. No enlarged abdominal or pelvic lymph nodes.  Reproductive: Uterus and bilateral adnexa are unremarkable. Other: No ascites Musculoskeletal: No significant skeletal lesions. IMPRESSION: High-grade small bowel obstruction within a midline ventral hernia 2 cm above the umbilicus. These results were called by telephone at the time of interpretation on 09/28/2016 at 6:19 am to Dr. Jeannett Senior , who verbally acknowledged these results. Incidental findings include nonobstructing left nephrolithiasis and a hiatal hernia. Electronically Signed   By: Andreas Newport M.D.   On: 09/28/2016 06:23      Assessment/Plan SBO secondary to midline ventral hernia - abdominal surgical history includes cholecystectomy 2008 and a tubal ligation - 5 days of progressive abdominal pain, distension, nausea, and vomiting - CT scan shows high-grade small bowel obstruction within a midline ventral hernia 2 cm above the umbilicus - WBC 250, lactic acid 6.22 - blood cultures pending  Hyponatremia - replace in IVF Hypokalemia - replace in IVF AKI - BUN/Cr 89/3.34, continue IVF   ID - cefotetan on call to OR VTE - SCDs FEN - IVF, NPO  Plan - Admit to med-surg.  Plan for OR later this morning for ventral hernia repair. Continue NPO, IVF resuscitation, pain control, and antiemetics.   Jerrye Beavers, [facility] 09/28/2016, 8:04 AM Pager: [phone] Consults: [phone] Mon-Fri 7:00 am-4:30 pm Sat-Sun 7:00 am-11:30 am

## 2016-09-29 LAB — CBC
HCT: 33.2 % — ABNORMAL LOW (ref 36.0–46.0)
HEMOGLOBIN: 11.6 g/dL — AB (ref 12.0–15.0)
MCH: 29.3 pg (ref 26.0–34.0)
MCHC: 34.9 g/dL (ref 30.0–36.0)
MCV: 83.8 fL (ref 78.0–100.0)
Platelets: 190 10*3/uL (ref 150–400)
RBC: 3.96 MIL/uL (ref 3.87–5.11)
RDW: 13.9 % (ref 11.5–15.5)
WBC: 5.6 10*3/uL (ref 4.0–10.5)

## 2016-09-29 LAB — BASIC METABOLIC PANEL
Anion gap: 5 (ref 5–15)
BUN: 24 mg/dL — AB (ref 6–20)
CHLORIDE: 109 mmol/L (ref 101–111)
CO2: 23 mmol/L (ref 22–32)
Calcium: 8 mg/dL — ABNORMAL LOW (ref 8.9–10.3)
Creatinine, Ser: 0.74 mg/dL (ref 0.44–1.00)
GFR calc Af Amer: >60 mL/min (ref >60)
GFR calc non Af Amer: >60 mL/min (ref >60)
Glucose, Bld: 123 mg/dL — ABNORMAL HIGH (ref 65–99)
POTASSIUM: 3.5 mmol/L (ref 3.5–5.1)
SODIUM: 137 mmol/L (ref 135–145)

## 2016-09-29 LAB — HIV ANTIBODY (ROUTINE TESTING W REFLEX): HIV Screen 4th Generation wRfx: NONREACTIVE

## 2016-09-29 NOTE — Progress Notes (Signed)
Patient ID: Heidi Galvan, female   DOB: 01/14/1966, 51 y.o.   MRN: [id number] [facility] Progress Note:   1 Day Post-Op  Subjective: Mental status is clear.  Some shoulder pain and mild abdominal pain Objective: Vital signs in last 24 hours: Temp:  [97.8 F (36.6 C)-98.9 F (37.2 C)] 98.2 F (36.8 C) (03/10 0527) Pulse Rate:  [59-85] 67 (03/10 0527) Resp:  [14-21] 16 (03/10 0527) BP: (108-136)/(59-81) 108/63 (03/10 0527) SpO2:  [95 %-100 %] 100 % (03/10 0527)  Intake/Output from previous day: 03/09 0701 - 03/10 0700 In: 4071.7 [I.V.:4071.7] Out: 2310 [Urine:2060; Blood:250] Intake/Output this shift: Total I/O In: 180 [P.O.:180] Out: -   Physical Exam: Work of breathing is normal.  Abdomen is soft without rebound.   Lab Results:  Results for orders placed or performed during the hospital encounter of 09/28/16 (from the past 48 hour(s))  Lipase, blood     Status: None   Collection Time: 09/28/16  4:51 AM  Result Value Ref Range   Lipase 38 11 - 51 U/L  Comprehensive metabolic panel     Status: Abnormal   Collection Time: 09/28/16  4:51 AM  Result Value Ref Range   Sodium 127 (L) 135 - 145 mmol/L   Potassium 2.8 (L) 3.5 - 5.1 mmol/L   Chloride 83 (L) 101 - 111 mmol/L   CO2 18 (L) 22 - 32 mmol/L   Glucose, Bld 269 (H) 65 - 99 mg/dL   BUN 89 (H) 6 - 20 mg/dL   Creatinine, Ser 3.34 (H) 0.44 - 1.00 mg/dL   Calcium 9.7 8.9 - 10.3 mg/dL   Total Protein 9.8 (H) 6.5 - 8.1 g/dL   Albumin 5.4 (H) 3.5 - 5.0 g/dL   AST 49 (H) 15 - 41 U/L   ALT 45 14 - 54 U/L   Alkaline Phosphatase 107 38 - 126 U/L   Total Bilirubin 1.9 (H) 0.3 - 1.2 mg/dL   GFR calc non Af Amer 15 (L) >60 mL/min   GFR calc Af Amer 17 (L) >60 mL/min    Comment: (NOTE) The eGFR has been calculated using the CKD EPI equation. This calculation has not been validated in all clinical situations. eGFR's persistently <60 mL/min signify possible Chronic Kidney Disease.    Anion gap 26 (H) 5 - 15     Comment: REPEATED TO VERIFY  CBC     Status: Abnormal   Collection Time: 09/28/16  4:51 AM  Result Value Ref Range   WBC 16.5 (H) 4.0 - 10.5 K/uL   RBC 6.43 (H) 3.87 - 5.11 MIL/uL   Hemoglobin 19.0 (H) 12.0 - 15.0 g/dL   HCT 51.1 (H) 36.0 - 46.0 %   MCV 79.5 78.0 - 100.0 fL   MCH 29.5 26.0 - 34.0 pg   MCHC 37.2 (H) 30.0 - 36.0 g/dL    Comment: RULED OUT INTERFERING SUBSTANCES   RDW 13.7 11.5 - 15.5 %   Platelets 289 150 - 400 K/uL  Differential     Status: Abnormal   Collection Time: 09/28/16  4:51 AM  Result Value Ref Range   Neutrophils Relative % 84 %   Lymphocytes Relative 10 %   Monocytes Relative 6 %   Eosinophils Relative 0 %   Basophils Relative 0 %   Neutro Abs 13.8 (H) 1.7 - 7.7 K/uL   Lymphs Abs 1.7 0.7 - 4.0 K/uL   Monocytes Absolute 1.0 0.1 - 1.0 K/uL   Eosinophils Absolute 0.0 0.0 - 0.7  K/uL   Basophils Absolute 0.0 0.0 - 0.1 K/uL   Smear Review LARGE PLATELETS PRESENT   Magnesium     Status: None   Collection Time: 09/28/16  4:51 AM  Result Value Ref Range   Magnesium 2.1 1.7 - 2.4 mg/dL  I-Stat CG4 Lactic Acid, ED     Status: Abnormal   Collection Time: 09/28/16  5:10 AM  Result Value Ref Range   Lactic Acid, Venous 6.22 (HH) 0.5 - 1.9 mmol/L   Comment NOTIFIED PHYSICIAN   Blood culture (routine x 2)     Status: None (Preliminary result)   Collection Time: 09/28/16  6:20 AM  Result Value Ref Range   Specimen Description BLOOD LEFT HAND    Special Requests BOTTLES DRAWN AEROBIC AND ANAEROBIC 5CC    Culture      NO GROWTH 1 DAY Performed at [facility], [place] [address] [place], [place] [address]    Report Status PENDING   Blood culture (routine x 2)     Status: None (Preliminary result)   Collection Time: 09/28/16  6:37 AM  Result Value Ref Range   Specimen Description BLOOD RIGHT ANTECUBITAL    Special Requests BOTTLES DRAWN AEROBIC AND ANAEROBIC 10CC    Culture      NO GROWTH 1 DAY Performed at [facility], [place] [address]  [place], [place] [address]    Report Status PENDING   I-Stat CG4 Lactic Acid, ED     Status: None   Collection Time: 09/28/16  8:31 AM  Result Value Ref Range   Lactic Acid, Venous 1.71 0.5 - 1.9 mmol/L  Urinalysis, Routine w reflex microscopic     Status: Abnormal   Collection Time: 09/28/16  9:14 AM  Result Value Ref Range   Color, Urine YELLOW YELLOW   APPearance CLEAR CLEAR   Specific Gravity, Urine 1.016 1.005 - 1.030   pH 5.0 5.0 - 8.0   Glucose, UA NEGATIVE NEGATIVE mg/dL   Hgb urine dipstick MODERATE (A) NEGATIVE   Bilirubin Urine NEGATIVE NEGATIVE   Ketones, ur NEGATIVE NEGATIVE mg/dL   Protein, ur NEGATIVE NEGATIVE mg/dL   Nitrite NEGATIVE NEGATIVE   Leukocytes, UA NEGATIVE NEGATIVE   RBC / HPF 0-5 0 - 5 RBC/hpf   WBC, UA 0-5 0 - 5 WBC/hpf   Bacteria, UA RARE (A) NONE SEEN   Squamous Epithelial / LPF 0-5 (A) NONE SEEN   Mucous PRESENT    Hyaline Casts, UA PRESENT   HIV antibody (Routine Testing)     Status: None   Collection Time: 09/28/16  1:55 PM  Result Value Ref Range   HIV Screen 4th Generation wRfx Non Reactive Non Reactive    Comment: (NOTE) Performed At: [facility] [place], [place] [id number] Lindon Romp MD HQ:[id number]   CBC     Status: Abnormal   Collection Time: 09/29/16  5:24 AM  Result Value Ref Range   WBC 5.6 4.0 - 10.5 K/uL   RBC 3.96 3.87 - 5.11 MIL/uL   Hemoglobin 11.6 (L) 12.0 - 15.0 g/dL    Comment: DELTA CHECK NOTED REPEATED TO VERIFY    HCT 33.2 (L) 36.0 - 46.0 %   MCV 83.8 78.0 - 100.0 fL   MCH 29.3 26.0 - 34.0 pg   MCHC 34.9 30.0 - 36.0 g/dL   RDW 13.9 11.5 - 15.5 %   Platelets 190 150 - 400 K/uL  Basic metabolic panel     Status:  Abnormal   Collection Time: 09/29/16  5:24 AM  Result Value Ref Range   Sodium 137 135 - 145 mmol/L    Comment: DELTA CHECK NOTED   Potassium 3.5 3.5 - 5.1 mmol/L   Chloride 109 101 - 111 mmol/L   CO2 23 22 - 32 mmol/L   Glucose, Bld 123 (H) 65 - 99 mg/dL   BUN 24 (H) 6 -  20 mg/dL   Creatinine, Ser 0.74 0.44 - 1.00 mg/dL   Calcium 8.0 (L) 8.9 - 10.3 mg/dL   GFR calc non Af Amer >60 >60 mL/min   GFR calc Af Amer >60 >60 mL/min    Comment: (NOTE) The eGFR has been calculated using the CKD EPI equation. This calculation has not been validated in all clinical situations. eGFR's persistently <60 mL/min signify possible Chronic Kidney Disease.    Anion gap 5 5 - 15    Radiology/Results: Ct Abdomen Pelvis Wo Contrast  Result Date: 09/28/2016 CLINICAL DATA:  Abdominal pain, nausea and vomiting. EXAM: CT ABDOMEN AND PELVIS WITHOUT CONTRAST TECHNIQUE: Multidetector CT imaging of the abdomen and pelvis was performed following the standard protocol without IV contrast. COMPARISON:  04/02/2013 FINDINGS: Lower chest: No acute findings Hepatobiliary: No focal liver abnormality is seen. Status post cholecystectomy. No biliary dilatation. Pancreas: Unremarkable. No pancreatic ductal dilatation or surrounding inflammatory changes. Spleen: Normal in size without focal abnormality. Adrenals/Urinary Tract: Adrenal glands are unremarkable. Nonobstructing 3 x 5 mm lower pole left collecting system calculus. No significant renal parenchymal lesions. No ureteral calculi. Urinary bladder is unremarkable. Stomach/Bowel: Hiatal hernia.  Otherwise unremarkable stomach. High-grade small bowel obstruction caused by a midline ventral hernia 2 cm above the umbilicus. Beyond the hernia, ileum is decompressed. Colon is decompressed. Proximal to the hernia, small bowel is distended with air-fluid levels. No extraluminal gas. Vascular/Lymphatic: No significant vascular findings are present. No enlarged abdominal or pelvic lymph nodes. Reproductive: Uterus and bilateral adnexa are unremarkable. Other: No ascites Musculoskeletal: No significant skeletal lesions. IMPRESSION: High-grade small bowel obstruction within a midline ventral hernia 2 cm above the umbilicus. These results were called by telephone at  the time of interpretation on 09/28/2016 at 6:19 am to Dr. Jeannett Senior , who verbally acknowledged these results. Incidental findings include nonobstructing left nephrolithiasis and a hiatal hernia. Electronically Signed   By: Andreas Newport M.D.   On: 09/28/2016 06:23    Anti-infectives: Anti-infectives    Start     Dose/Rate Route Frequency Ordered Stop   09/28/16 0900  cefoTEtan (CEFOTAN) 2 g in dextrose 5 % 50 mL IVPB     2 g 100 mL/hr over 30 Minutes Intravenous On call to O.R. 09/28/16 0850 09/28/16 0953      Assessment/Plan: Problem List: Patient Active Problem List   Diagnosis Date Noted  . Incarcerated ventral hernia 09/28/2016  . Ventral hernia with obstruction 09/28/2016  . Left knee pain 02/23/2015  . Viral URI with cough 10/19/2013  . Knee joint pain 07/06/2013  . Tooth abscess 12/28/2011  . Mole (skin) 01/27/2011  . EXCESSIVE MENSTRUAL BLEEDING 05/23/2010  . IRON DEFICIENCY ANEMIA, HX OF 05/23/2010  . DEPRESSION 04/25/2010  . MIGRAINE UNSP W/O INTRACT W/O STATUS MIGRAINOSUS 04/25/2010    Not quite ready for discharge.  Probable discharge tomorrow.   1 Day Post-Op    LOS: 1 day   Matt B. Hassell Done, MD, [facility] [phone] beeper [phone]  09/29/2016 9:50 AM

## 2016-09-30 LAB — CBC
HCT: 33.2 % — ABNORMAL LOW (ref 36.0–46.0)
Hemoglobin: 11.2 g/dL — ABNORMAL LOW (ref 12.0–15.0)
MCH: 28.6 pg (ref 26.0–34.0)
MCHC: 33.7 g/dL (ref 30.0–36.0)
MCV: 84.9 fL (ref 78.0–100.0)
PLATELETS: 175 10*3/uL (ref 150–400)
RBC: 3.91 MIL/uL (ref 3.87–5.11)
RDW: 14.1 % (ref 11.5–15.5)
WBC: 5.9 10*3/uL (ref 4.0–10.5)

## 2016-09-30 LAB — BASIC METABOLIC PANEL
Anion gap: 4 — ABNORMAL LOW (ref 5–15)
BUN: 13 mg/dL (ref 6–20)
CHLORIDE: 108 mmol/L (ref 101–111)
CO2: 24 mmol/L (ref 22–32)
CREATININE: 0.57 mg/dL (ref 0.44–1.00)
Calcium: 7.5 mg/dL — ABNORMAL LOW (ref 8.9–10.3)
GFR calc Af Amer: >60 mL/min (ref >60)
Glucose, Bld: 102 mg/dL — ABNORMAL HIGH (ref 65–99)
Potassium: 3.6 mmol/L (ref 3.5–5.1)
SODIUM: 136 mmol/L (ref 135–145)

## 2016-09-30 MED ORDER — HYDROCODONE-ACETAMINOPHEN 5-325 MG PO TABS: 1.0000 | ORAL_TABLET | ORAL | 0 refills | Status: DC | PRN

## 2016-09-30 NOTE — Discharge Summary (Signed)
Physician Discharge Summary  Patient ID: Heidi Galvan MRN: [id number] DOB/AGE: 11-05-1965 51 y.o.  Admit date: 09/28/2016 Discharge date: 09/30/2016  Admission Diagnoses:  Small bowel obstruction related to incarcerated ventral hernia  Discharge Diagnoses:  same  Active Problems:   Incarcerated ventral hernia   Ventral hernia with obstruction   Surgery:  Open exploration and relief of ventral hernia; closure of fascial defect  Discharged Condition: improved  Hospital Course:   Had surgery.  Observed and did well.  Ready for discharge  Consults: none  Significant Diagnostic Studies: none    Discharge Exam: Blood pressure 130/79, pulse (!) 57, temperature 98.7 F (37.1 C), temperature source Oral, resp. rate 16, height 5' (1.524 m), weight 64.9 kg (143 lb), SpO2 100 %. Incision closed with Dermabond and looks good  Disposition: 01-Home or Self Care  Discharge Instructions    Call MD for:  persistant nausea and vomiting    Complete by:  As directed    Call MD for:  severe uncontrolled pain    Complete by:  As directed    Diet - low sodium heart healthy    Complete by:  As directed    Discharge instructions    Complete by:  As directed    May shower May return to work in 3 weeks   Increase activity slowly    Complete by:  As directed      Allergies as of 09/30/2016      Reactions   Cholestatin    Penicillins Rash   Very mild rash on hand and was more than 20 years ago      Medication List    STOP taking these medications   albuterol 108 (90 Base) MCG/ACT inhaler Commonly known as:  PROVENTIL HFA;VENTOLIN HFA   ferrous sulfate 325 (65 FE) MG tablet   fluticasone 50 MCG/ACT nasal spray Commonly known as:  FLONASE   guaiFENesin 600 MG 12 hr tablet Commonly known as:  MUCINEX   meloxicam 15 MG tablet Commonly known as:  MOBIC   phenol 1.4 % Liqd Commonly known as:  CHLORASEPTIC     TAKE these medications   FLUoxetine 20 MG capsule Commonly known  as:  PROZAC Take 1 capsule (20 mg total) by mouth daily.   HYDROcodone-acetaminophen 5-325 MG tablet Commonly known as:  NORCO/VICODIN Take 1-2 tablets by mouth every 4 (four) hours as needed for moderate pain.   montelukast 10 MG tablet Commonly known as:  SINGULAIR Take 1 tablet (10 mg total) by mouth daily.   sodium chloride 0.65 % nasal spray Commonly known as:  OCEAN NASAL SPRAY Place 1 spray into the nose as needed for congestion.   traZODone 100 MG tablet Commonly known as:  DESYREL Take 0.5 tablets (50 mg total) by mouth at bedtime.   VITAMIN B 12 PO Take 1 tablet by mouth daily.      Follow-up Information    TOTH Joneen Boers, MD Follow up.   Specialty:  General Surgery Contact information: [place] [address] [phone]        Belva Chimes, MD .   Specialty:  Orthopedic Surgery Contact information: [place] [address] [phone]           Signed: Pedro Earls 09/30/2016, 10:53 AM

## 2016-09-30 NOTE — Discharge Instructions (Signed)
Dolor abdominal en adultos  (Abdominal Pain, Adult)  El dolor de estómago (abdominal) puede tener muchas causas. La mayoría de las veces, el dolor de estómago no es peligroso. Muchos de estos casos de dolor de estómago pueden controlarse y tratarse en casa.  CUIDADOS EN EL HOGAR  · No tome medicamentos que lo ayuden a defecar (laxantes), salvo que su médico se lo indique.  · Solo tome los medicamentos que le haya indicado su médico.  · Coma o beba lo que le indique su médico. Su médico le dirá si debe seguir una dieta especial.    SOLICITE AYUDA SI:  · No sabe cuál es la causa del dolor de estómago.  · Tiene dolor de estómago cuando siente ganas de vomitar (náuseas) o tiene colitis (diarrea).  · Tiene dolor durante la micción o la evacuación.  · El dolor de estómago lo despierta de noche.  · Tiene dolor de estómago que empeora o mejora cuando come.  · Tiene dolor de estómago que empeora cuando come alimentos grasosos.  · Tiene fiebre.    SOLICITE AYUDA DE INMEDIATO SI:  · El dolor no desaparece en un plazo máximo de 2 horas.  · No deja de (vomitar).  · El dolor cambia y se localiza solo en la parte derecha o izquierda del estómago.  · La materia fecal es sanguinolenta o de aspecto alquitranado.    ASEGÚRESE DE QUE:  · Comprende estas instrucciones.  · Controlará su afección.  · Recibirá ayuda de inmediato si no mejora o si empeora.    Esta información no tiene como fin reemplazar el consejo del médico. Asegúrese de hacerle al médico cualquier pregunta que tenga.  Document Released: 10/05/2008 Document Revised: 07/30/2014 Document Reviewed: 12/21/2015  Elsevier Interactive Patient Education © 2017 [employer] Inc[employer]

## 2016-09-30 NOTE — Progress Notes (Signed)
Pt's vital are WNL, tolerating diet and pain is under control. Discussed discharge instructions with both patient and children. Discharged to home with prescription.

## 2016-10-01 LAB — COMPREHENSIVE METABOLIC PANEL
ALBUMIN: 5.4 g/dL — AB (ref 3.5–5.0)
ALK PHOS: 107 U/L (ref 38–126)
ALT: 45 U/L (ref 14–54)
AST: 49 U/L — AB (ref 15–41)
Anion gap: 26 — ABNORMAL HIGH (ref 5–15)
BILIRUBIN TOTAL: 1.9 mg/dL — AB (ref 0.3–1.2)
BUN: 89 mg/dL — AB (ref 6–20)
CALCIUM: 9.7 mg/dL (ref 8.9–10.3)
CO2: 18 mmol/L — ABNORMAL LOW (ref 22–32)
CREATININE: 3.34 mg/dL — AB (ref 0.44–1.00)
Chloride: 83 mmol/L — ABNORMAL LOW (ref 101–111)
GFR calc Af Amer: 17 mL/min — ABNORMAL LOW (ref >60)
GFR, EST NON AFRICAN AMERICAN: 15 mL/min — AB (ref >60)
GLUCOSE: 269 mg/dL — AB (ref 65–99)
Potassium: 2.8 mmol/L — ABNORMAL LOW (ref 3.5–5.1)
Sodium: 127 mmol/L — ABNORMAL LOW (ref 135–145)
TOTAL PROTEIN: 9.8 g/dL — AB (ref 6.5–8.1)

## 2016-10-03 LAB — CULTURE, BLOOD (ROUTINE X 2)
CULTURE: NO GROWTH
Culture: NO GROWTH

## 2016-11-05 ENCOUNTER — Other Ambulatory Visit: Payer: Self-pay | Admitting: General Surgery

## 2016-11-05 DIAGNOSIS — K439 Ventral hernia without obstruction or gangrene: Secondary | ICD-10-CM

## 2016-12-24 NOTE — Addendum Note (Signed)
Addendum  created 12/24/16 1214 by Myrtie Soman, MD   Sign clinical note

## 2016-12-24 NOTE — Anesthesia Postprocedure Evaluation (Signed)
Anesthesia Post Note  Patient: Heidi Galvan  Procedure(s) Performed: Procedure(s) (LRB): HERNIA REPAIR VENTRAL ADULT (N/A)     Anesthesia Post Evaluation  Last Vitals:  Vitals:   09/29/16 2142 09/30/16 0524  BP: 108/61 130/79  Pulse: 62 (!) 57  Resp: 16 16  Temp: 37.3 C 37.1 C    Last Pain:  Vitals:   09/30/16 1322  TempSrc:   PainSc: 8                  [name] S

## 2018-03-19 ENCOUNTER — Encounter (HOSPITAL_COMMUNITY): Payer: Self-pay | Admitting: Emergency Medicine

## 2018-03-19 ENCOUNTER — Emergency Department (HOSPITAL_COMMUNITY)
Admission: EM | Admit: 2018-03-19 | Discharge: 2018-03-19 | Disposition: A | Discharge status: Home / Self Care | Payer: No Typology Code available for payment source | Attending: Emergency Medicine | Admitting: Emergency Medicine

## 2018-03-19 ENCOUNTER — Other Ambulatory Visit: Payer: Self-pay

## 2018-03-19 ENCOUNTER — Emergency Department (HOSPITAL_COMMUNITY): Payer: No Typology Code available for payment source

## 2018-03-19 DIAGNOSIS — Z79899 Other long term (current) drug therapy: Secondary | ICD-10-CM | POA: Insufficient documentation

## 2018-03-19 DIAGNOSIS — M546 Pain in thoracic spine: Secondary | ICD-10-CM | POA: Insufficient documentation

## 2018-03-19 DIAGNOSIS — F329 Major depressive disorder, single episode, unspecified: Secondary | ICD-10-CM | POA: Insufficient documentation

## 2018-03-19 DIAGNOSIS — Z87891 Personal history of nicotine dependence: Secondary | ICD-10-CM | POA: Insufficient documentation

## 2018-03-19 DIAGNOSIS — Z9049 Acquired absence of other specified parts of digestive tract: Secondary | ICD-10-CM | POA: Insufficient documentation

## 2018-03-19 LAB — BASIC METABOLIC PANEL
Anion gap: 7 (ref 5–15)
BUN: 17 mg/dL (ref 6–20)
CHLORIDE: 109 mmol/L (ref 98–111)
CO2: 25 mmol/L (ref 22–32)
CREATININE: 0.66 mg/dL (ref 0.44–1.00)
Calcium: 9.1 mg/dL (ref 8.9–10.3)
GFR calc Af Amer: >60 mL/min (ref >60)
GFR calc non Af Amer: >60 mL/min (ref >60)
GLUCOSE: 109 mg/dL — AB (ref 70–99)
Potassium: 4 mmol/L (ref 3.5–5.1)
Sodium: 141 mmol/L (ref 135–145)

## 2018-03-19 LAB — URINALYSIS, ROUTINE W REFLEX MICROSCOPIC
BILIRUBIN URINE: NEGATIVE
GLUCOSE, UA: NEGATIVE mg/dL
KETONES UR: NEGATIVE mg/dL
Nitrite: NEGATIVE
PH: 5 (ref 5.0–8.0)
Protein, ur: NEGATIVE mg/dL
Specific Gravity, Urine: 1.016 (ref 1.005–1.030)

## 2018-03-19 LAB — CBC
HCT: 40.6 % (ref 36.0–46.0)
HEMOGLOBIN: 13.6 g/dL (ref 12.0–15.0)
MCH: 29.7 pg (ref 26.0–34.0)
MCHC: 33.5 g/dL (ref 30.0–36.0)
MCV: 88.6 fL (ref 78.0–100.0)
PLATELETS: 274 10*3/uL (ref 150–400)
RBC: 4.58 MIL/uL (ref 3.87–5.11)
RDW: 14.1 % (ref 11.5–15.5)
WBC: 4.8 10*3/uL (ref 4.0–10.5)

## 2018-03-19 LAB — I-STAT TROPONIN, ED: Troponin i, poc: 0.01 ng/mL (ref 0.00–0.08)

## 2018-03-19 LAB — I-STAT BETA HCG BLOOD, ED (MC, WL, AP ONLY): I-stat hCG, quantitative: <5 m[IU]/mL (ref <5)

## 2018-03-19 MED ORDER — KETOROLAC TROMETHAMINE 30 MG/ML IJ SOLN
15.0000 mg | Freq: Once | INTRAMUSCULAR | Status: AC
  Administered 2018-03-19: 15 mg via INTRAVENOUS
  Filled 2018-03-19: qty 1

## 2018-03-19 MED ORDER — METHOCARBAMOL 500 MG PO TABS: 500.0000 mg | ORAL_TABLET | Freq: Three times a day (TID) | ORAL | 0 refills | Status: AC | PRN

## 2018-03-19 NOTE — ED Notes (Signed)
Patient transported to X-ray 

## 2018-03-19 NOTE — ED Provider Notes (Signed)
[facility] DEPT Provider Note   CSN: [id number] Arrival date & time: 03/19/18  6295     History   Chief Complaint Chief Complaint  Patient presents with  . Back Pain  . Chest Pain    HPI Heidi Galvan is a 52 y.o. female.  HPI Patient resents with back pain for last few days.  In her upper mid back and will radiate to her chest at times.  Reportedly has been under a lot of stress.  Pain is worse with certain movements and with standing.  No fevers or chills.  No shortness of breath.  Has not really had pains like this before.  No lightheadedness or dizziness.  Pain is sharp.  No abdominal pain.  Sometimes the pain will go down to her kidney areas.  Occasion will radiate down to her legs. Past Medical History:  Diagnosis Date  . Depression   . Iron deficiency anemia   . Menometrorrhagia   . Migraine     Patient Active Problem List   Diagnosis Date Noted  . Incarcerated ventral hernia 09/28/2016  . Ventral hernia with obstruction 09/28/2016  . Left knee pain 02/23/2015  . Viral URI with cough 10/19/2013  . Knee joint pain 07/06/2013  . Tooth abscess 12/28/2011  . Mole (skin) 01/27/2011  . EXCESSIVE MENSTRUAL BLEEDING 05/23/2010  . IRON DEFICIENCY ANEMIA, HX OF 05/23/2010  . DEPRESSION 04/25/2010  . MIGRAINE UNSP W/O INTRACT W/O STATUS MIGRAINOSUS 04/25/2010    Past Surgical History:  Procedure Laterality Date  . CHOLECYSTECTOMY    . DILATION AND CURETTAGE OF UTERUS  2012  . VENTRAL HERNIA REPAIR N/A 09/28/2016   Procedure: HERNIA REPAIR VENTRAL ADULT;  Surgeon: Autumn Messing III, MD;  Location: [facility];  Service: General;  Laterality: N/A;     OB History    Gravida  3   Para  3   Term  3   Preterm  0   AB  0   Living  3     SAB  0   TAB  0   Ectopic  0   Multiple  0   Live Births               Home Medications    Prior to Admission medications   Medication Sig Start Date End Date Taking? Authorizing  Provider  Ascorbic Acid (VITAMIN C PO) Take 1 tablet by mouth daily.    Yes [provider]  Cyanocobalamin (VITAMIN B 12 PO) Take 1 tablet by mouth daily.   Yes [provider]  ibuprofen (ADVIL,MOTRIN) 200 MG tablet Take 800 mg by mouth 2 (two) times daily as needed for moderate pain.   Yes [provider]  Liniments (SALONPAS PAIN RELIEF PATCH EX) Apply 2 patches topically daily as needed (aches/pain).   Yes [provider]  MAGNESIUM PO Take 1 tablet by mouth daily.    Yes [provider]  Multiple Vitamins-Minerals (ZINC PO) Take 1 tablet by mouth daily.    Yes [provider]  FLUoxetine (PROZAC) 20 MG capsule Take 1 capsule (20 mg total) by mouth daily. Patient not taking: Reported on 03/19/2018 11/09/11 07/07/13  Heinz Knuckles, MD  HYDROcodone-acetaminophen (NORCO/VICODIN) 5-325 MG tablet Take 1-2 tablets by mouth every 4 (four) hours as needed for moderate pain. Patient not taking: Reported on 03/19/2018 09/30/16   Johnathan Hausen, MD  methocarbamol (ROBAXIN) 500 MG tablet Take 1 tablet (500 mg total) by mouth every 8 (eight)  hours as needed for muscle spasms. 03/19/18   Davonna Belling, MD  montelukast (SINGULAIR) 10 MG tablet Take 1 tablet (10 mg total) by mouth daily. Patient not taking: Reported on 03/19/2018 06/02/13 06/02/14  Hoyt Koch, MD  sodium chloride (OCEAN NASAL SPRAY) 0.65 % nasal spray Place 1 spray into the nose as needed for congestion. Patient not taking: Reported on 03/19/2018 05/19/12 05/19/13  Dominic Pea, DO  traZODone (DESYREL) 100 MG tablet Take 0.5 tablets (50 mg total) by mouth at bedtime. Patient not taking: Reported on 03/19/2018 11/12/11 12/12/11  Heinz Knuckles, MD    Family History Family History  Problem Relation Age of Onset  . Asthma Mother   . Hypertension Mother   . Asthma Daughter   . Asthma Son     Social History Social History   Tobacco Use  . Smoking status: Former  Smoker    Last attempt to quit: 09/12/1981    Years since quitting: 36.5  . Smokeless tobacco: Never Used  Substance Use Topics  . Alcohol use: No    Alcohol/week: 0.0 standard drinks  . Drug use: No     Allergies   Cholestatin and Penicillins   Review of Systems Review of Systems  Constitutional: Negative for appetite change and fever.  HENT: Negative for congestion.   Respiratory: Negative for shortness of breath.   Cardiovascular: Positive for chest pain.  Gastrointestinal: Negative for abdominal pain.  Genitourinary: Positive for flank pain. Negative for dysuria.  Musculoskeletal: Positive for back pain. Negative for neck pain.  Skin: Negative for wound.  Neurological: Negative for weakness, light-headedness and numbness.  Hematological: Negative for adenopathy.  Psychiatric/Behavioral: Negative for confusion.     Physical Exam Updated Vital Signs BP 123/73   Pulse (!) 55   Temp 97.9 F (36.6 C) (Oral)   Resp 16   SpO2 97%   Physical Exam  Constitutional: She appears well-developed.  HENT:  Head: Atraumatic.  Neck: Normal range of motion.  Cardiovascular: Regular rhythm.  Pulmonary/Chest: Effort normal.  Abdominal: Soft.  Musculoskeletal:  Tenderness over upper thoracic spine.  No deformity.  No step-off.  Neurological: She is alert.  Skin: Skin is warm. Capillary refill takes less than 2 seconds.  Psychiatric: She has a normal mood and affect.     ED Treatments / Results  Labs (all labs ordered are listed, but only abnormal results are displayed) Labs Reviewed  BASIC METABOLIC PANEL - Abnormal; Notable for the following components:      Result Value   Glucose, Bld 109 (*)    All other components within normal limits  URINALYSIS, ROUTINE W REFLEX MICROSCOPIC - Abnormal; Notable for the following components:   Hgb urine dipstick SMALL (*)    Leukocytes, UA LARGE (*)    Bacteria, UA RARE (*)    All other components within normal limits  CBC    I-STAT TROPONIN, ED  I-STAT BETA HCG BLOOD, ED ([facility], [facility], [facility] ONLY)    EKG EKG Interpretation  Date/Time:  Wednesday March 19 2018 09:07:45 EDT Ventricular Rate:  64 PR Interval:    QRS Duration: 90 QT Interval:  399 QTC Calculation: 412 R Axis:   74 Text Interpretation:  Sinus rhythm Confirmed by Davonna Belling [phone]) on 03/19/2018 9:14:00 AM   Radiology Dg Chest 2 View  Result Date: 03/19/2018 CLINICAL DATA:  Upper back and chest pain for 4 days EXAM: CHEST - 2 VIEW COMPARISON:  04/30/2017 FINDINGS: Normal heart size and mediastinal contours. No acute  infiltrate or edema. No effusion or pneumothorax. No acute osseous findings. IMPRESSION: Negative chest. Electronically Signed   By: Monte Fantasia M.D.   On: 03/19/2018 09:44    Procedures Procedures (including critical care time)  Medications Ordered in ED Medications  ketorolac (TORADOL) 30 MG/ML injection 15 mg (15 mg Intravenous Given 03/19/18 1112)     Initial Impression / Assessment and Plan / ED Course  I have reviewed the triage vital signs and the nursing notes.  Pertinent labs & imaging results that were available during my care of the patient were reviewed by me and considered in my medical decision making (see chart for details).     Patient with back pain.  Mid back.  Some radiation anteriorly.  At this point I think it is likely musculoskeletal.  EKG reassuring.  X-ray reassuring.  Not a tearing pain.  I think dissection felt less likely.  Worse with palpation on the thoracic spine.  Will treat with muscle relaxers and follow-up as needed. Final Clinical Impressions(s) / ED Diagnoses   Final diagnoses:  Acute midline thoracic back pain    ED Discharge Orders         Ordered    methocarbamol (ROBAXIN) 500 MG tablet  Every 8 hours PRN     03/19/18 1232           Davonna Belling, MD 03/19/18 1239

## 2018-03-19 NOTE — ED Notes (Signed)
Pt feels she is unable to urinate at this time. Given 240 mL of water.

## 2018-03-19 NOTE — ED Triage Notes (Signed)
Pt reports that she has upper back pain for 4 days. Reports that pain will radiate to her left chest. 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 Denies any falls or injuries.

## 2021-02-09 ENCOUNTER — Other Ambulatory Visit: Payer: Self-pay

## 2021-02-09 ENCOUNTER — Emergency Department (HOSPITAL_COMMUNITY)
Admission: EM | Admit: 2021-02-09 | Discharge: 2021-02-09 | Disposition: A | Discharge status: Home / Self Care | Payer: No Typology Code available for payment source | Attending: Emergency Medicine | Admitting: Emergency Medicine

## 2021-02-09 ENCOUNTER — Emergency Department (HOSPITAL_COMMUNITY): Payer: No Typology Code available for payment source

## 2021-02-09 DIAGNOSIS — R109 Unspecified abdominal pain: Secondary | ICD-10-CM

## 2021-02-09 DIAGNOSIS — N200 Calculus of kidney: Secondary | ICD-10-CM | POA: Insufficient documentation

## 2021-02-09 DIAGNOSIS — N23 Unspecified renal colic: Secondary | ICD-10-CM

## 2021-02-09 DIAGNOSIS — Z87891 Personal history of nicotine dependence: Secondary | ICD-10-CM | POA: Insufficient documentation

## 2021-02-09 LAB — COMPREHENSIVE METABOLIC PANEL WITH GFR
ALT: 15 U/L (ref 0–44)
AST: 19 U/L (ref 15–41)
Albumin: 4.2 g/dL (ref 3.5–5.0)
Alkaline Phosphatase: 78 U/L (ref 38–126)
Anion gap: 12 (ref 5–15)
BUN: 24 mg/dL — ABNORMAL HIGH (ref 6–20)
CO2: 23 mmol/L (ref 22–32)
Calcium: 9.3 mg/dL (ref 8.9–10.3)
Chloride: 104 mmol/L (ref 98–111)
Creatinine, Ser: 0.97 mg/dL (ref 0.44–1.00)
GFR, Estimated: >60 mL/min
Glucose, Bld: 217 mg/dL — ABNORMAL HIGH (ref 70–99)
Potassium: 4.1 mmol/L (ref 3.5–5.1)
Sodium: 139 mmol/L (ref 135–145)
Total Bilirubin: 1.1 mg/dL (ref 0.3–1.2)
Total Protein: 7.6 g/dL (ref 6.5–8.1)

## 2021-02-09 LAB — CBC WITH DIFFERENTIAL/PLATELET
Abs Immature Granulocytes: 0.06 10*3/uL (ref 0.00–0.07)
Basophils Absolute: 0.1 10*3/uL (ref 0.0–0.1)
Basophils Relative: 1 %
Eosinophils Absolute: 0.1 10*3/uL (ref 0.0–0.5)
Eosinophils Relative: 1 %
HCT: 38.8 % (ref 36.0–46.0)
Hemoglobin: 12.7 g/dL (ref 12.0–15.0)
Immature Granulocytes: 1 %
Lymphocytes Relative: 19 %
Lymphs Abs: 1.9 10*3/uL (ref 0.7–4.0)
MCH: 28.3 pg (ref 26.0–34.0)
MCHC: 32.7 g/dL (ref 30.0–36.0)
MCV: 86.6 fL (ref 80.0–100.0)
Monocytes Absolute: 0.5 10*3/uL (ref 0.1–1.0)
Monocytes Relative: 5 %
Neutro Abs: 7.7 10*3/uL (ref 1.7–7.7)
Neutrophils Relative %: 73 %
Platelets: 304 10*3/uL (ref 150–400)
RBC: 4.48 MIL/uL (ref 3.87–5.11)
RDW: 14.3 % (ref 11.5–15.5)
WBC: 10.3 10*3/uL (ref 4.0–10.5)
nRBC: 0 % (ref 0.0–0.2)

## 2021-02-09 LAB — URINALYSIS, ROUTINE W REFLEX MICROSCOPIC
Bacteria, UA: NONE SEEN
Bilirubin Urine: NEGATIVE
Glucose, UA: NEGATIVE mg/dL
Ketones, ur: NEGATIVE mg/dL
Nitrite: NEGATIVE
Protein, ur: 100 mg/dL — AB
RBC / HPF: >50 RBC/hpf — ABNORMAL HIGH (ref 0–5)
Specific Gravity, Urine: 1.02 (ref 1.005–1.030)
pH: 6 (ref 5.0–8.0)

## 2021-02-09 LAB — LIPASE, BLOOD: Lipase: 42 U/L (ref 11–51)

## 2021-02-09 MED ORDER — TAMSULOSIN HCL 0.4 MG PO CAPS: 0.4000 mg | ORAL_CAPSULE | Freq: Every day | ORAL | 0 refills | Status: AC

## 2021-02-09 MED ORDER — OXYCODONE-ACETAMINOPHEN 5-325 MG PO TABS: 1.0000 | ORAL_TABLET | Freq: Four times a day (QID) | ORAL | 0 refills | Status: AC | PRN

## 2021-02-09 MED ORDER — ONDANSETRON 4 MG PO TBDP
4.0000 mg | ORAL_TABLET | Freq: Once | ORAL | Status: AC
  Administered 2021-02-09: 4 mg via ORAL
  Filled 2021-02-09: qty 1

## 2021-02-09 MED ORDER — OXYCODONE-ACETAMINOPHEN 5-325 MG PO TABS
1.0000 | ORAL_TABLET | Freq: Once | ORAL | Status: AC
  Administered 2021-02-09: 1 via ORAL
  Filled 2021-02-09: qty 1

## 2021-02-09 MED ORDER — ONDANSETRON HCL 4 MG PO TABS: 4.0000 mg | ORAL_TABLET | Freq: Four times a day (QID) | ORAL | 0 refills | Status: AC

## 2021-02-09 MED ORDER — KETOROLAC TROMETHAMINE 15 MG/ML IJ SOLN
15.0000 mg | Freq: Once | INTRAMUSCULAR | Status: AC
  Administered 2021-02-09: 15 mg via INTRAMUSCULAR
  Filled 2021-02-09: qty 1

## 2021-02-09 NOTE — Discharge Instructions (Addendum)
Le he recetado 3 diferentes medicinas para tratar sus calculos en el rinon izquierdo.  Zofran, es una medicina para la nausea.Debe tomar esta medicina cada 6 horas para la nausea.  La segunda medicina es percocet, esta medicina es para IT trainer. Tome 1 tableta cada 6 horas solo para Conservation officer, historic buildings intenso.  La tercera medicina es flomax, esta va a ayudar con la expulsion del calculo del rinon.  La tercera

## 2021-02-09 NOTE — ED Provider Notes (Signed)
Emergency Medicine Provider Triage Evaluation Note  Heidi Galvan , a 55 y.o. female  was evaluated in triage.  Pt complains of left mid abdominal pain.  Review of Systems  Positive vomiting nonbloody.  No diarrhea.  No fevers.  No cough no chest pain. N  Physical Exam  BP (!) 146/109 (BP Location: Left Arm)   Pulse 65   Temp 98 F (36.7 C) (Oral)   Resp (!) 22   Ht 5' (1.524 m)   Wt 68 kg   SpO2 98%   BMI 29.29 kg/m  Gen:   Awake, no distress Resp:  Normal effort MSK:   Moves extremities without difficulty no peripheral edema noted Other:  Moderate tenderness in the left mid abdominal region.  No CVA tenderness.  No guarding or rebound noted.  Medical Decision Making  Medically screening exam initiated at 7:44 AM.  Appropriate orders placed.  Heidi Galvan was informed that the remainder of the evaluation will be completed by another provider, this initial triage assessment does not replace that evaluation, and the importance of remaining in the ED until their evaluation is complete.  History of kidney stones and prior cholecystectomy coming in with 7 hours of left-sided abdominal pain.  Symptoms are starting to improve at this time.   Luna Fuse, MD 02/09/21 [phone]

## 2021-02-09 NOTE — ED Notes (Signed)
Pt also reports blood in urine. Pt given cup for urinalysis and informed of need for urine specimen.

## 2021-02-09 NOTE — ED Provider Notes (Signed)
[facility] DEPT Provider Note   CSN: [id number] Arrival date & time: 02/09/21  7782     History Chief Complaint  Patient presents with   Abdominal Pain    Heidi Galvan is a 55 y.o. female.  55 y.o female with a past medical history of cholecystectomy presents to the ED with a chief complaint of remittent left flank pain which began early this morning. She states the pain wax and wanes but has improved since her arrival in the ED.  Having the pain as intermittent contraction to the left flank with radiation to the left upper quadrant.  Reports when the pain first occurred, she broke out into sweats, felt nauseated, attempted to void, however felt like she was unable to completely void along with witness some gross hematuria.  Pain is exacerbated with movement along with urination.  No medications prior tried to arrival.  No chest pain, shortness of breath, no cough, no recent sick contacts.   The history is provided by the patient.  Abdominal Pain Pain location:  L flank Pain quality: throbbing   Pain radiates to:  LUQ Pain severity:  Severe Onset quality:  Sudden Duration:  8 hours Timing:  Constant Progression:  Waxing and waning Chronicity:  New Context: eating   Context: not alcohol use and not sick contacts   Relieved by:  Nothing Worsened by:  Movement and vomiting Ineffective treatments:  None tried Associated symptoms: fatigue and nausea   Associated symptoms: no chest pain, no constipation, no cough, no diarrhea, no fever, no shortness of breath and no vomiting       Past Medical History:  Diagnosis Date   Depression    Iron deficiency anemia    Menometrorrhagia    Migraine     Patient Active Problem List   Diagnosis Date Noted   Incarcerated ventral hernia 09/28/2016   Ventral hernia with obstruction 09/28/2016   Left knee pain 02/23/2015   Viral URI with cough 10/19/2013   Knee joint pain 07/06/2013   Tooth abscess  12/28/2011   Mole (skin) 01/27/2011   EXCESSIVE MENSTRUAL BLEEDING 05/23/2010   IRON DEFICIENCY ANEMIA, HX OF 05/23/2010   DEPRESSION 04/25/2010   MIGRAINE UNSP W/O INTRACT W/O STATUS MIGRAINOSUS 04/25/2010    Past Surgical History:  Procedure Laterality Date   CHOLECYSTECTOMY     DILATION AND CURETTAGE OF UTERUS  2012   VENTRAL HERNIA REPAIR N/A 09/28/2016   Procedure: HERNIA REPAIR VENTRAL ADULT;  Surgeon: Autumn Messing III, MD;  Location: [facility];  Service: General;  Laterality: N/A;     OB History     Gravida  3   Para  3   Term  3   Preterm  0   AB  0   Living  3      SAB  0   IAB  0   Ectopic  0   Multiple  0   Live Births              Family History  Problem Relation Age of Onset   Asthma Mother    Hypertension Mother    Asthma Daughter    Asthma Son     Social History   Tobacco Use   Smoking status: Former    Types: Cigarettes    Quit date: 09/12/1981    Years since quitting: 39.4   Smokeless tobacco: Never  Substance Use Topics   Alcohol use: No    Alcohol/week: 0.0 standard drinks  Drug use: No    Home Medications Prior to Admission medications   Medication Sig Start Date End Date Taking? Authorizing Provider  ondansetron (ZOFRAN) 4 MG tablet Take 1 tablet (4 mg total) by mouth every 6 (six) hours for 7 days. 02/09/21 02/16/21 Yes Derran Sear, Beverley Fiedler, PA-C  oxyCODONE-acetaminophen (PERCOCET/ROXICET) 5-325 MG tablet Take 1 tablet by mouth every 6 (six) hours as needed for up to 3 days for severe pain. 02/09/21 02/12/21 Yes Zymier Rodgers, Beverley Fiedler, PA-C  tamsulosin (FLOMAX) 0.4 MG CAPS capsule Take 1 capsule (0.4 mg total) by mouth daily for 7 days. 02/09/21 02/16/21 Yes Zahki Hoogendoorn, Beverley Fiedler, PA-C  Ascorbic Acid (VITAMIN C PO) Take 1 tablet by mouth daily.     [provider]  Cyanocobalamin (VITAMIN B 12 PO) Take 1 tablet by mouth daily.    [provider]  FLUoxetine (PROZAC) 20 MG capsule Take 1 capsule (20 mg total) by mouth daily. Patient not  taking: Reported on 03/19/2018 11/09/11 07/07/13  Heinz Knuckles, MD  ibuprofen (ADVIL,MOTRIN) 200 MG tablet Take 800 mg by mouth 2 (two) times daily as needed for moderate pain.    [provider]  Liniments (SALONPAS PAIN RELIEF PATCH EX) Apply 2 patches topically daily as needed (aches/pain).    [provider]  MAGNESIUM PO Take 1 tablet by mouth daily.     [provider]  methocarbamol (ROBAXIN) 500 MG tablet Take 1 tablet (500 mg total) by mouth every 8 (eight) hours as needed for muscle spasms. 03/19/18   Davonna Belling, MD  montelukast (SINGULAIR) 10 MG tablet Take 1 tablet (10 mg total) by mouth daily. Patient not taking: Reported on 03/19/2018 06/02/13 06/02/14  Hoyt Koch, MD  Multiple Vitamins-Minerals (ZINC PO) Take 1 tablet by mouth daily.     [provider]  sodium chloride (OCEAN NASAL SPRAY) 0.65 % nasal spray Place 1 spray into the nose as needed for congestion. Patient not taking: Reported on 03/19/2018 05/19/12 05/19/13  Dominic Pea, DO  traZODone (DESYREL) 100 MG tablet Take 0.5 tablets (50 mg total) by mouth at bedtime. Patient not taking: Reported on 03/19/2018 11/12/11 12/12/11  Heinz Knuckles, MD    Allergies    Cholestatin and Penicillins  Review of Systems   Review of Systems  Constitutional:  Positive for fatigue. Negative for fever.  Respiratory:  Negative for cough and shortness of breath.   Cardiovascular:  Negative for chest pain.  Gastrointestinal:  Positive for abdominal pain and nausea. Negative for blood in stool, constipation, diarrhea and vomiting.  Genitourinary:  Positive for flank pain and urgency.  Neurological:  Negative for headaches.  All other systems reviewed and are negative.  Physical Exam Updated Vital Signs BP 110/86   Pulse 73   Temp 98.4 F (36.9 C) (Oral)   Resp 18   Ht 5' (1.524 m)   Wt 68 kg   SpO2 99%   BMI 29.29 kg/m   Physical Exam Vitals and nursing note  reviewed.  Constitutional:      Appearance: She is well-developed.  HENT:     Head: Normocephalic and atraumatic.  Cardiovascular:     Rate and Rhythm: Normal rate.  Pulmonary:     Effort: Pulmonary effort is normal.     Breath sounds: No wheezing or rales.  Abdominal:     General: Bowel sounds are normal.     Palpations: Abdomen is soft.     Tenderness: There is abdominal tenderness in the left upper quadrant. There is  left CVA tenderness. There is no guarding.     Hernia: A hernia is present. There is no hernia in the umbilical area.  Skin:    General: Skin is warm and dry.  Neurological:     Mental Status: She is alert.    ED Results / Procedures / Treatments   Labs (all labs ordered are listed, but only abnormal results are displayed) Labs Reviewed  COMPREHENSIVE METABOLIC PANEL - Abnormal; Notable for the following components:      Result Value   Glucose, Bld 217 (*)    BUN 24 (*)    All other components within normal limits  URINALYSIS, ROUTINE W REFLEX MICROSCOPIC - Abnormal; Notable for the following components:   APPearance HAZY (*)    Hgb urine dipstick LARGE (*)    Protein, ur 100 (*)    Leukocytes,Ua SMALL (*)    RBC / HPF >50 (*)    All other components within normal limits  LIPASE, BLOOD  CBC WITH DIFFERENTIAL/PLATELET    EKG None  Radiology CT Abdomen Pelvis Wo Contrast  Result Date: 02/09/2021 CLINICAL DATA:  Acute abdominal pain on the left for several hours, initial encounter EXAM: CT ABDOMEN AND PELVIS WITHOUT CONTRAST TECHNIQUE: Multidetector CT imaging of the abdomen and pelvis was performed following the standard protocol without IV contrast. COMPARISON:  09/28/2016 FINDINGS: Lower chest: Lung bases are free of acute infiltrate or sizable effusion. Stable fat containing diaphragmatic hernia is noted posteriorly on the right. Hepatobiliary: No focal liver abnormality is seen. Status post cholecystectomy. No biliary dilatation. Pancreas:  Unremarkable. No pancreatic ductal dilatation or surrounding inflammatory changes. Spleen: Normal in size without focal abnormality. Adrenals/Urinary Tract: Adrenal glands are within normal limits. Right kidney is unremarkable. Left kidney is somewhat enlarged with mild perinephric stranding and mild hydronephrosis and hydroureter. This extends inferiorly 2 the level of the ureterovesical junction. There are what appear to be 2 separate stones at the UVJ. The maximum dimension of the 2 stones together is 8 x 3 mm. The bladder is decompressed. Stomach/Bowel: No obstructive or inflammatory changes of the colon are seen. The appendix is within normal limits. Small bowel and stomach show a small hiatal hernia. Vascular/Lymphatic: No significant vascular findings are present. No enlarged abdominal or pelvic lymph nodes. Reproductive: Uterus and bilateral adnexa are unremarkable. Other: No abdominal wall hernia or abnormality. No abdominopelvic ascites. Musculoskeletal: Mild degenerative changes of lumbar spine are noted. IMPRESSION: Left UVJ calculi which measure in total 8 x 3 mm with proximal hydronephrosis and hydroureter. No other acute abnormality is noted. Electronically Signed   By: Inez Catalina M.D.   On: 02/09/2021 08:30    Procedures Procedures   Medications Ordered in ED Medications  ondansetron (ZOFRAN-ODT) disintegrating tablet 4 mg (4 mg Oral Given 02/09/21 0758)  oxyCODONE-acetaminophen (PERCOCET/ROXICET) 5-325 MG per tablet 1 tablet (1 tablet Oral Given 02/09/21 0756)  ketorolac (TORADOL) 15 MG/ML injection 15 mg (15 mg Intramuscular Given 02/09/21 1239)    ED Course  I have reviewed the triage vital signs and the nursing notes.  Pertinent labs & imaging results that were available during my care of the patient were reviewed by me and considered in my medical decision making (see chart for details).  Clinical Course as of 02/09/21 1306  Thu Feb 09, 2021  1211 Chalmers Guest): SMALL [JS]   1914 WBC, UA: 11-20 [JS]  1221 Creatinine: 0.97 [JS]    Clinical Course User Index [JS] Janeece Fitting, PA-C   MDM Rules/Calculators/A&P  Presents to the ED with sudden onset of left flank pain which began early this morning, exacerbated with movement.  When pain came on, patient got diaphoretic, nauseated but did not have any episode of emesis.  She does report exacerbated with urination, there is some urgency.  Prior history of cholecystectomy, feels that the pain is somewhat the same, describing it as a contraction of her back.  She is without any fever, URI symptoms, cough.  She did have a URI illness about 2 weeks ago but this later improved.  She received Percocet while waiting in our waiting room.  During my evaluation she is nontoxic, non-ill-appearing, vitals are within normal limits, there is no signs of hypoxia or tachycardia.  Lungs are clear to auscultation with any wheezing, rhonchi, rales.  There is left CVA tenderness.  Hernia is also palpated along her umbilicus, does have a prior history of hernia repair.  She is voiding adequately, no blood in her stool.  No bilateral leg swelling, rest of the exam is benign.  Interpretation of her labs reveal a CBC with no leukocytosis, no signs of anemia.  CMP without any electrolyte derangement, creatinine levels within normal limits.  LFTs are unremarkable.  Lipase level is normal.  UA remarkable for small leukocytes, 11-20 white blood cell count, no bacteria seen and specimen is clear.  CT renal was order while in the waiting room and that showed: Left UVJ calculi which measure in total 8 x 3 mm with proximal  hydronephrosis and hydroureter.     No other acute abnormality is noted.      These results were discussed at length with patient, she was also provided with a copy of her CT abdomen and pelvis for her records.  She did report some improvement in pain, no IV has been established, we will trial some IM Toradol to help with pain  control.  I feel that is reasonable patient be treated on outpatient course of Flomax along with narcotic medication for pain control.  She is also given that number to nephrology should she need any outpatient follow-up.  1:05 PM Patient reassed with some improvement in pain after toradol. Stable for discharge with urology follow up.    Portions of this note were generated with Lobbyist. Dictation errors may occur despite best attempts at proofreading.  Final Clinical Impression(s) / ED Diagnoses Final diagnoses:  Left flank pain  Renal colic on left side    Rx / DC Orders ED Discharge Orders          Ordered    oxyCODONE-acetaminophen (PERCOCET/ROXICET) 5-325 MG tablet  Every 6 hours PRN        02/09/21 1241    tamsulosin (FLOMAX) 0.4 MG CAPS capsule  Daily        02/09/21 1241    ondansetron (ZOFRAN) 4 MG tablet  Every 6 hours        02/09/21 1241             Janeece Fitting, PA-C 02/09/21 1306    Luna Fuse, MD 02/09/21 1313

## 2021-02-09 NOTE — ED Triage Notes (Signed)
Pt reports left side/left upper quadrant pain and nausea/vomiting that started around 0100 this am.  Pt reports that the pain is very sharp and comes in waves.  Denies urinary complications.  Last BM 02/08/2021.

## 2021-02-09 NOTE — ED Notes (Signed)
An After Visit Summary was printed and given to the patient. Discharge instructions given and no further questions at this time.  Pt leaving with family.  

## 2023-01-14 ENCOUNTER — Emergency Department: Payer: Worker's Compensation

## 2023-01-14 ENCOUNTER — Emergency Department
Admission: EM | Admit: 2023-01-14 | Discharge: 2023-01-14 | Disposition: A | Discharge status: Home / Self Care | Payer: Worker's Compensation | Attending: Emergency Medicine | Admitting: Emergency Medicine

## 2023-01-14 ENCOUNTER — Other Ambulatory Visit: Payer: Self-pay

## 2023-01-14 ENCOUNTER — Encounter: Payer: Self-pay | Admitting: Emergency Medicine

## 2023-01-14 DIAGNOSIS — S52511A Displaced fracture of right radial styloid process, initial encounter for closed fracture: Secondary | ICD-10-CM

## 2023-01-14 DIAGNOSIS — Y990 Civilian activity done for income or pay: Secondary | ICD-10-CM | POA: Diagnosis not present

## 2023-01-14 DIAGNOSIS — W19XXXA Unspecified fall, initial encounter: Secondary | ICD-10-CM | POA: Diagnosis not present

## 2023-01-14 DIAGNOSIS — S52501A Unspecified fracture of the lower end of right radius, initial encounter for closed fracture: Secondary | ICD-10-CM | POA: Diagnosis not present

## 2023-01-14 DIAGNOSIS — M25531 Pain in right wrist: Secondary | ICD-10-CM | POA: Diagnosis present

## 2023-01-14 MED ORDER — FENTANYL CITRATE PF 50 MCG/ML IJ SOSY
50.0000 ug | PREFILLED_SYRINGE | Freq: Once | INTRAMUSCULAR | Status: AC
  Administered 2023-01-14: 50 ug via INTRAMUSCULAR
  Filled 2023-01-14: qty 1

## 2023-01-14 MED ORDER — OXYCODONE-ACETAMINOPHEN 5-325 MG PO TABS: 1.0000 | ORAL_TABLET | Freq: Four times a day (QID) | ORAL | 0 refills | Status: AC | PRN

## 2023-01-14 NOTE — ED Provider Notes (Signed)
[facility] Provider Note    Event Date/Time   First MD Initiated Contact with Patient 01/14/23 0940     (approximate)   History   Arm Injury   HPI  Heidi Galvan is a 57 y.o. female with no significant past medical history presents emergency department stating that she fell at work today.  She works at [employer] and [employer].  Landed on the right arm.  Patient is right-handed.  Has a lot of swelling to the arm and the wrist.  No other injuries reported.  No numbness or tingling      Physical Exam   Triage Vital Signs: ED Triage Vitals [01/14/23 0937]  Enc Vitals Group     BP      Pulse      Resp      Temp      Temp src      SpO2      Weight      Height      Head Circumference      Peak Flow      Pain Score 10     Pain Loc      Pain Edu?      Excl. in GC?     Most recent vital signs: There were no vitals filed for this visit.   General: Awake, no distress.   CV:  Good peripheral perfusion. regular rate and  rhythm Resp:  Normal effort.  Abd:  No distention.   Other:  Right wrist with large amount of swelling and tenderness noted at the distal radius and ulna, decreased range of motion secondary discomfort, neurovascular is intact   ED Results / Procedures / Treatments   Labs (all labs ordered are listed, but only abnormal results are displayed) Labs Reviewed - No data to display   EKG     RADIOLOGY X-ray of the right wrist and forearm    PROCEDURES:   .Ortho Injury Treatment  Date/Time: 01/14/2023 11:34 AM  Performed by: Faythe Ghee, PA-C Authorized by: Faythe Ghee, PA-C   Consent:    Consent obtained:  Verbal   Consent given by:  Patient   Risks discussed:  Fracture, nerve damage, restricted joint movement, vascular damage, stiffness, recurrent dislocation and irreducible dislocation   Alternatives discussed:  Alternative treatmentInjury location: wrist Location details: right wrist Injury  type: fracture Fracture type: distal radius and ulnar styloid Pre-procedure neurovascular assessment: neurovascularly intact Pre-procedure distal perfusion: normal Pre-procedure neurological function: normal Pre-procedure range of motion: normal  Anesthesia: Local anesthesia used: no  Patient sedated: NoManipulation performed: no Immobilization: splint Splint type: sugar tong Splint Applied by: ED Nurse and ED Provider Supplies used: cotton padding, elastic bandage and Ortho-Glass Post-procedure neurovascular assessment: post-procedure neurovascularly intact Post-procedure distal perfusion: normal Post-procedure neurological function: normal Post-procedure range of motion: normal      MEDICATIONS ORDERED IN ED: Medications  fentaNYL (SUBLIMAZE) injection 50 mcg (50 mcg Intramuscular Given 01/14/23 1115)     IMPRESSION / MDM / ASSESSMENT AND PLAN / ED COURSE  I reviewed the triage vital signs and the nursing notes.                              Differential diagnosis includes, but is not limited to, fracture, contusion, sprain  Patient's presentation is most consistent with acute presentation with potential threat to life or bodily function.   Due to the large amount of swelling  x-rays were ordered in triage  X-ray of the right wrist was independently reviewed interpreted by me as having a comminuted distal radius fracture.  Also a distal ulna styloid fracture.  I did explain findings to the patient.  I placed a ice pack on the arm to decrease swelling and removed her ring and gave it back to the patient.  Patient was given fentanyl 50 mcg IM.  Will do a sugar-tong OCL  Patient was given instructions to follow-up with orthopedics by calling make an appointment.  I did caution her to check with her Worker's Comp. insurance to make sure they cover [facility].  If not they should refer her to a orthopedist of their choice.  She is to elevate and ice.  Take ibuprofen as  needed for pain and swelling.  Percocet for pain not controlled by ibuprofen.  Worker's Comp. instructions were given stating no use of the right arm until released by orthopedics.  Patient is stable at discharge.  Strict instructions to return if worsening     FINAL CLINICAL IMPRESSION(S) / ED DIAGNOSES   Final diagnoses:  Closed fracture of distal end of right radius, unspecified fracture morphology, initial encounter  Closed displaced fracture of styloid process of right radius, initial encounter     Rx / DC Orders   ED Discharge Orders          Ordered    oxyCODONE-acetaminophen (PERCOCET) 5-325 MG tablet  Every 6 hours PRN        01/14/23 1123             Note:  This document was prepared using Dragon voice recognition software and may include unintentional dictation errors.    Faythe Ghee, PA-C 01/14/23 1136    Jene Every, MD 01/14/23 [phone]

## 2023-01-14 NOTE — Discharge Instructions (Signed)
Follow-up with orthopedics.  Call [facility] for an appointment.  However make sure your Worker's Comp[employer] insurance pays for this doctor.  If they do not approve of this doctor they will see need to 1 that they approved.  Otherwise you will get stuck with a bill.  Take pain medication as prescribed

## 2023-01-14 NOTE — ED Triage Notes (Addendum)
Pt comes with c/o right arm pain. Pt does have swelling to arm and wrist. Pt  states she fell today. Pt states pain to arm.   Pt states she had this happen at work. Pt is filing workers comp. Pt works at [employer] and [employer]
